# Patient Record
Sex: Female | Born: 1950 | Race: White | Hispanic: No | Marital: Married | Smoking: Never smoker
Health system: Southern US, Community
[De-identification: ages and names within clinical notes are randomized; demographics above are authoritative.]

## PROBLEM LIST (undated history)

## (undated) DIAGNOSIS — E7800 Pure hypercholesterolemia, unspecified: Secondary | ICD-10-CM

## (undated) DIAGNOSIS — K219 Gastro-esophageal reflux disease without esophagitis: Secondary | ICD-10-CM

## (undated) DIAGNOSIS — M1990 Unspecified osteoarthritis, unspecified site: Secondary | ICD-10-CM

## (undated) DIAGNOSIS — H269 Unspecified cataract: Secondary | ICD-10-CM

## (undated) DIAGNOSIS — E119 Type 2 diabetes mellitus without complications: Secondary | ICD-10-CM

## (undated) DIAGNOSIS — R42 Dizziness and giddiness: Secondary | ICD-10-CM

## (undated) DIAGNOSIS — I10 Essential (primary) hypertension: Secondary | ICD-10-CM

## (undated) DIAGNOSIS — E039 Hypothyroidism, unspecified: Secondary | ICD-10-CM

## (undated) DIAGNOSIS — T7840XA Allergy, unspecified, initial encounter: Secondary | ICD-10-CM

## (undated) HISTORY — DX: Unspecified cataract: H26.9

## (undated) HISTORY — PX: DILATION AND CURETTAGE OF UTERUS: SHX78

## (undated) HISTORY — PX: IR FIBRIN GLUE REPAIR ANAL FISTULA: IMG2325

## (undated) HISTORY — DX: Allergy, unspecified, initial encounter: T78.40XA

## (undated) HISTORY — DX: Unspecified osteoarthritis, unspecified site: M19.90

---

## 2001-07-07 ENCOUNTER — Emergency Department (HOSPITAL_COMMUNITY): Admission: EM | Admit: 2001-07-07 | Discharge: 2001-07-07 | Payer: Self-pay | Admitting: Emergency Medicine

## 2001-09-04 ENCOUNTER — Other Ambulatory Visit: Admission: RE | Admit: 2001-09-04 | Discharge: 2001-09-04 | Payer: Self-pay | Admitting: *Deleted

## 2002-06-20 ENCOUNTER — Encounter: Payer: Self-pay | Admitting: Surgery

## 2002-06-20 ENCOUNTER — Encounter: Admission: RE | Admit: 2002-06-20 | Discharge: 2002-06-20 | Payer: Self-pay | Admitting: Surgery

## 2002-07-16 ENCOUNTER — Ambulatory Visit (HOSPITAL_BASED_OUTPATIENT_CLINIC_OR_DEPARTMENT_OTHER): Admission: RE | Admit: 2002-07-16 | Discharge: 2002-07-16 | Payer: Self-pay | Admitting: Surgery

## 2007-09-24 ENCOUNTER — Encounter: Admission: RE | Admit: 2007-09-24 | Discharge: 2007-09-24 | Payer: Self-pay | Admitting: Orthopedic Surgery

## 2010-01-25 ENCOUNTER — Encounter: Admission: RE | Admit: 2010-01-25 | Discharge: 2010-01-25 | Payer: Self-pay | Admitting: Sports Medicine

## 2010-04-14 ENCOUNTER — Ambulatory Visit (HOSPITAL_BASED_OUTPATIENT_CLINIC_OR_DEPARTMENT_OTHER): Admission: RE | Admit: 2010-04-14 | Discharge: 2010-04-14 | Payer: Self-pay | Admitting: Gynecology

## 2011-01-18 LAB — POCT I-STAT 4, (NA,K, GLUC, HGB,HCT)
Glucose, Bld: 127 mg/dL — ABNORMAL HIGH (ref 70–99)
HCT: 40 % (ref 36.0–46.0)
Hemoglobin: 13.6 g/dL (ref 12.0–15.0)
Potassium: 3.5 mEq/L (ref 3.5–5.1)
Sodium: 141 mEq/L (ref 135–145)

## 2011-01-18 LAB — GLUCOSE, CAPILLARY: Glucose-Capillary: 135 mg/dL — ABNORMAL HIGH (ref 70–99)

## 2011-03-19 NOTE — Op Note (Signed)
NAME:  Angelica Mccarthy, Angelica Mccarthy                          ACCOUNT NO.:  [id number]   MEDICAL RECORD NO.:  [id number]                   PATIENT TYPE:  AMB   LOCATION:  DSC                                  FACILITY:  [facility]   PHYSICIAN:  Thornton Park. Daphine Deutscher, M.D.             DATE OF BIRTH:  07-Nov-1950   DATE OF PROCEDURE:  07/16/2002  DATE OF DISCHARGE:                                 OPERATIVE REPORT   CCS#  [id number]   PREOPERATIVE DIAGNOSES:  Twenty-year history of recurrent perirectal  abscesses and probable fistula.   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   OPERATION PERFORMED:   SURGEON:  Matthew B. Daphine Deutscher, M.D.   ANESTHESIA:  General.   INDICATIONS FOR PROCEDURE:  The patient is a 60 year old lady who was seen  in the office by one of my partners back in March with recurrent perirectal  abscess which was drained.  She presented to me in August and at the 11  o'clock position, she had a little of drainage which with the probe appeared  to be a fistula.  Informed consent was obtained regarding fistulectomy with  risk being incontinence.  Also mentioned was the possibility of putting in a  seton.   DESCRIPTION OF PROCEDURE:  The patient was taken to room 8 [facility] and placed in stirrups and given general anesthesia.  Anal  exam showed her to have numerous skin tags externally.  She had scarring  particularly on the right side at the 10 o'clock position where she has had  this recurrent abscess.  Through the small little purulent draining sinus, I  inserted a probe which with my finger in the anus and rectum I was able to  feel it go in radially.  It seemed to go into the distal half centimeter and  it seemed to be within the external subcutaneous sphincter.  With the anal  retractor in place, I then began opening from outside in.  I felt that this  was an external subcu  sphincter and that complete fistulotomy fistulectomy  was in order.  I divided this with a knife onto the probe.  I then used  scissors to cut out the friable tissue lining the fistula and then destroyed  the rest with electrocautery.  This all was consistent with inflammatory  reactive tissue.  Hemostasis was achieved and lidocaine  was injected into the area.  Betadine ointment was massaged into the area.  The patient was given Vicodin to take for pain and will be followed up in  the office in two to three weeks.  Instructions were given regarding sitz  baths.   PLAN:  Return in two to three weeks.  Thornton Park Daphine Deutscher, M.D.    MBM/MEDQ  D:  07/16/2002  T:  07/16/2002  Job:  [id number]   cc:   Oley Balm. Georgina Pillion, M.D.  [address]  [place]  [place] [address]  Fax: [phone]

## 2016-05-27 ENCOUNTER — Ambulatory Visit
Admission: RE | Admit: 2016-05-27 | Discharge: 2016-05-27 | Disposition: A | Discharge status: Home / Self Care | Payer: BLUE CROSS/BLUE SHIELD | Source: Ambulatory Visit | Attending: Family Medicine | Admitting: Family Medicine

## 2016-05-27 ENCOUNTER — Other Ambulatory Visit: Payer: Self-pay | Admitting: Family Medicine

## 2016-05-27 DIAGNOSIS — R1013 Epigastric pain: Secondary | ICD-10-CM

## 2016-05-27 MED ORDER — IOPAMIDOL (ISOVUE-300) INJECTION 61%
100.0000 mL | Freq: Once | INTRAVENOUS | Status: AC | PRN
  Administered 2016-05-27: 100 mL via INTRAVENOUS

## 2016-05-28 ENCOUNTER — Ambulatory Visit
Admission: RE | Admit: 2016-05-28 | Discharge: 2016-05-28 | Disposition: A | Discharge status: Home / Self Care | Payer: BLUE CROSS/BLUE SHIELD | Source: Ambulatory Visit | Attending: Family Medicine | Admitting: Family Medicine

## 2016-05-28 ENCOUNTER — Other Ambulatory Visit: Payer: Self-pay | Admitting: Family Medicine

## 2016-05-28 DIAGNOSIS — R918 Other nonspecific abnormal finding of lung field: Secondary | ICD-10-CM

## 2016-05-28 MED ORDER — IOPAMIDOL (ISOVUE-300) INJECTION 61%
75.0000 mL | Freq: Once | INTRAVENOUS | Status: AC | PRN
  Administered 2016-05-28: 75 mL via INTRAVENOUS

## 2016-05-31 ENCOUNTER — Other Ambulatory Visit: Payer: Self-pay | Admitting: Family Medicine

## 2016-05-31 DIAGNOSIS — R1013 Epigastric pain: Secondary | ICD-10-CM

## 2016-06-04 ENCOUNTER — Ambulatory Visit
Admission: RE | Admit: 2016-06-04 | Discharge: 2016-06-04 | Disposition: A | Discharge status: Home / Self Care | Payer: BLUE CROSS/BLUE SHIELD | Source: Ambulatory Visit | Attending: Family Medicine | Admitting: Family Medicine

## 2016-06-04 DIAGNOSIS — R1013 Epigastric pain: Secondary | ICD-10-CM

## 2016-12-16 ENCOUNTER — Other Ambulatory Visit: Payer: Self-pay | Admitting: Family Medicine

## 2016-12-16 DIAGNOSIS — R918 Other nonspecific abnormal finding of lung field: Secondary | ICD-10-CM

## 2016-12-20 ENCOUNTER — Ambulatory Visit
Admission: RE | Admit: 2016-12-20 | Discharge: 2016-12-20 | Disposition: A | Discharge status: Home / Self Care | Payer: BLUE CROSS/BLUE SHIELD | Source: Ambulatory Visit | Attending: Family Medicine | Admitting: Family Medicine

## 2016-12-20 DIAGNOSIS — R918 Other nonspecific abnormal finding of lung field: Secondary | ICD-10-CM

## 2017-10-04 DIAGNOSIS — J329 Chronic sinusitis, unspecified: Secondary | ICD-10-CM | POA: Diagnosis not present

## 2017-10-12 DIAGNOSIS — M9905 Segmental and somatic dysfunction of pelvic region: Secondary | ICD-10-CM | POA: Diagnosis not present

## 2017-10-12 DIAGNOSIS — M9904 Segmental and somatic dysfunction of sacral region: Secondary | ICD-10-CM | POA: Diagnosis not present

## 2017-10-12 DIAGNOSIS — M5136 Other intervertebral disc degeneration, lumbar region: Secondary | ICD-10-CM | POA: Diagnosis not present

## 2017-10-12 DIAGNOSIS — M9903 Segmental and somatic dysfunction of lumbar region: Secondary | ICD-10-CM | POA: Diagnosis not present

## 2017-10-14 DIAGNOSIS — M9905 Segmental and somatic dysfunction of pelvic region: Secondary | ICD-10-CM | POA: Diagnosis not present

## 2017-10-14 DIAGNOSIS — M5136 Other intervertebral disc degeneration, lumbar region: Secondary | ICD-10-CM | POA: Diagnosis not present

## 2017-10-14 DIAGNOSIS — M9903 Segmental and somatic dysfunction of lumbar region: Secondary | ICD-10-CM | POA: Diagnosis not present

## 2017-10-14 DIAGNOSIS — M9904 Segmental and somatic dysfunction of sacral region: Secondary | ICD-10-CM | POA: Diagnosis not present

## 2017-10-27 DIAGNOSIS — M9903 Segmental and somatic dysfunction of lumbar region: Secondary | ICD-10-CM | POA: Diagnosis not present

## 2017-10-27 DIAGNOSIS — M9904 Segmental and somatic dysfunction of sacral region: Secondary | ICD-10-CM | POA: Diagnosis not present

## 2017-10-27 DIAGNOSIS — M5136 Other intervertebral disc degeneration, lumbar region: Secondary | ICD-10-CM | POA: Diagnosis not present

## 2017-10-27 DIAGNOSIS — M9905 Segmental and somatic dysfunction of pelvic region: Secondary | ICD-10-CM | POA: Diagnosis not present

## 2017-10-31 DIAGNOSIS — H25811 Combined forms of age-related cataract, right eye: Secondary | ICD-10-CM | POA: Diagnosis not present

## 2017-10-31 DIAGNOSIS — E113293 Type 2 diabetes mellitus with mild nonproliferative diabetic retinopathy without macular edema, bilateral: Secondary | ICD-10-CM | POA: Diagnosis not present

## 2017-10-31 DIAGNOSIS — H25813 Combined forms of age-related cataract, bilateral: Secondary | ICD-10-CM | POA: Diagnosis not present

## 2017-11-02 DIAGNOSIS — M5136 Other intervertebral disc degeneration, lumbar region: Secondary | ICD-10-CM | POA: Diagnosis not present

## 2017-11-02 DIAGNOSIS — M9905 Segmental and somatic dysfunction of pelvic region: Secondary | ICD-10-CM | POA: Diagnosis not present

## 2017-11-02 DIAGNOSIS — M9903 Segmental and somatic dysfunction of lumbar region: Secondary | ICD-10-CM | POA: Diagnosis not present

## 2017-11-02 DIAGNOSIS — M9904 Segmental and somatic dysfunction of sacral region: Secondary | ICD-10-CM | POA: Diagnosis not present

## 2017-11-07 DIAGNOSIS — M9903 Segmental and somatic dysfunction of lumbar region: Secondary | ICD-10-CM | POA: Diagnosis not present

## 2017-11-07 DIAGNOSIS — M5136 Other intervertebral disc degeneration, lumbar region: Secondary | ICD-10-CM | POA: Diagnosis not present

## 2017-11-07 DIAGNOSIS — M9905 Segmental and somatic dysfunction of pelvic region: Secondary | ICD-10-CM | POA: Diagnosis not present

## 2017-11-07 DIAGNOSIS — M9904 Segmental and somatic dysfunction of sacral region: Secondary | ICD-10-CM | POA: Diagnosis not present

## 2017-11-15 DIAGNOSIS — M9904 Segmental and somatic dysfunction of sacral region: Secondary | ICD-10-CM | POA: Diagnosis not present

## 2017-11-15 DIAGNOSIS — M9905 Segmental and somatic dysfunction of pelvic region: Secondary | ICD-10-CM | POA: Diagnosis not present

## 2017-11-15 DIAGNOSIS — M9903 Segmental and somatic dysfunction of lumbar region: Secondary | ICD-10-CM | POA: Diagnosis not present

## 2017-11-15 DIAGNOSIS — M5136 Other intervertebral disc degeneration, lumbar region: Secondary | ICD-10-CM | POA: Diagnosis not present

## 2017-11-16 NOTE — Patient Instructions (Signed)
Your procedure is scheduled on: 11/28/2017   Report to [facility] at  11   AM.  Call this number if you have problems the morning of surgery: [phone]   Do not eat food or drink liquids :After Midnight.      Take these medicines the morning of surgery with A SIP OF WATER: atenolol, zyrtec, avapro, levothyroxine, antivert, prilosec.   Do not wear jewelry, make-up or nail polish.  Do not wear lotions, powders, or perfumes. You may wear deodorant.  Do not shave 48 hours prior to surgery.  Do not bring valuables to the hospital.  Contacts, dentures or bridgework may not be worn into surgery.  Leave suitcase in the car. After surgery it may be brought to your room.  For patients admitted to the hospital, checkout time is 11:00 AM the day of discharge.   Patients discharged the day of surgery will not be allowed to drive home.  :     Please read over the following fact sheets that you were given: Coughing and Deep Breathing, Surgical Site Infection Prevention, Anesthesia Post-op Instructions and Care and Recovery After Surgery    Cataract A cataract is a clouding of the lens of the eye. When a lens becomes cloudy, vision is reduced based on the degree and nature of the clouding. Many cataracts reduce vision to some degree. Some cataracts make people more near-sighted as they develop. Other cataracts increase glare. Cataracts that are ignored and become worse can sometimes look white. The white color can be seen through the pupil. CAUSES   Aging. However, cataracts may occur at any age, even in newborns.   Certain drugs.   Trauma to the eye.   Certain diseases such as diabetes.   Specific eye diseases such as chronic inflammation inside the eye or a sudden attack of a rare form of glaucoma.   Inherited or acquired medical problems.  SYMPTOMS   Gradual, progressive drop in vision in the affected eye.   Severe, rapid visual loss. This most often happens when trauma is the cause.   DIAGNOSIS  To detect a cataract, an eye doctor examines the lens. Cataracts are best diagnosed with an exam of the eyes with the pupils enlarged (dilated) by drops.  TREATMENT  For an early cataract, vision may improve by using different eyeglasses or stronger lighting. If that does not help your vision, surgery is the only effective treatment. A cataract needs to be surgically removed when vision loss interferes with your everyday activities, such as driving, reading, or watching TV. A cataract may also have to be removed if it prevents examination or treatment of another eye problem. Surgery removes the cloudy lens and usually replaces it with a substitute lens (intraocular lens, IOL).  At a time when both you and your doctor agree, the cataract will be surgically removed. If you have cataracts in both eyes, only one is usually removed at a time. This allows the operated eye to heal and be out of danger from any possible problems after surgery (such as infection or poor wound healing). In rare cases, a cataract may be doing damage to your eye. In these cases, your caregiver may advise surgical removal right away. The vast majority of people who have cataract surgery have better vision afterward. HOME CARE INSTRUCTIONS  If you are not planning surgery, you may be asked to do the following:  Use different eyeglasses.   Use stronger or brighter lighting.   Ask your eye  doctor about reducing your medicine dose or changing medicines if it is thought that a medicine caused your cataract. Changing medicines does not make the cataract go away on its own.   Become familiar with your surroundings. Poor vision can lead to injury. Avoid bumping into things on the affected side. You are at a higher risk for tripping or falling.   Exercise extreme care when driving or operating machinery.   Wear sunglasses if you are sensitive to bright light or experiencing problems with glare.  SEEK IMMEDIATE MEDICAL CARE  IF:   You have a worsening or sudden vision loss.   You notice redness, swelling, or increasing pain in the eye.   You have a fever.  Document Released: 10/18/2005 Document Revised: 10/07/2011 Document Reviewed: 06/11/2011 [facility] Patient Information 2012 [place].PATIENT INSTRUCTIONS POST-ANESTHESIA  IMMEDIATELY FOLLOWING SURGERY:  Do not drive or operate machinery for the first twenty four hours after surgery.  Do not make any important decisions for twenty four hours after surgery or while taking narcotic pain medications or sedatives.  If you develop intractable nausea and vomiting or a severe headache please notify your doctor immediately.  FOLLOW-UP:  Please make an appointment with your surgeon as instructed. You do not need to follow up with anesthesia unless specifically instructed to do so.  WOUND CARE INSTRUCTIONS (if applicable):  Keep a dry clean dressing on the anesthesia/puncture wound site if there is drainage.  Once the wound has quit draining you may leave it open to air.  Generally you should leave the bandage intact for twenty four hours unless there is drainage.  If the epidural site drains for more than 36-48 hours please call the anesthesia department.  QUESTIONS?:  Please feel free to call your physician or the hospital operator if you have any questions, and they will be happy to assist you.

## 2017-11-21 ENCOUNTER — Encounter (HOSPITAL_COMMUNITY): Payer: Self-pay

## 2017-11-21 ENCOUNTER — Encounter (HOSPITAL_COMMUNITY)
Admission: RE | Admit: 2017-11-21 | Discharge: 2017-11-21 | Disposition: A | Discharge status: Home / Self Care | Payer: Medicare Other | Source: Ambulatory Visit | Attending: Ophthalmology | Admitting: Ophthalmology

## 2017-11-21 ENCOUNTER — Other Ambulatory Visit: Payer: Self-pay

## 2017-11-21 DIAGNOSIS — Z01818 Encounter for other preprocedural examination: Secondary | ICD-10-CM | POA: Insufficient documentation

## 2017-11-21 DIAGNOSIS — H269 Unspecified cataract: Secondary | ICD-10-CM | POA: Diagnosis not present

## 2017-11-21 DIAGNOSIS — Z01812 Encounter for preprocedural laboratory examination: Secondary | ICD-10-CM | POA: Diagnosis not present

## 2017-11-21 HISTORY — DX: Dizziness and giddiness: R42

## 2017-11-21 HISTORY — DX: Essential (primary) hypertension: I10

## 2017-11-21 HISTORY — DX: Type 2 diabetes mellitus without complications: E11.9

## 2017-11-21 HISTORY — DX: Pure hypercholesterolemia, unspecified: E78.00

## 2017-11-21 HISTORY — DX: Hypothyroidism, unspecified: E03.9

## 2017-11-21 HISTORY — DX: Gastro-esophageal reflux disease without esophagitis: K21.9

## 2017-11-21 LAB — BASIC METABOLIC PANEL
Anion gap: 11 (ref 5–15)
BUN: 15 mg/dL (ref 6–20)
CALCIUM: 9.7 mg/dL (ref 8.9–10.3)
CO2: 26 mmol/L (ref 22–32)
CREATININE: 0.82 mg/dL (ref 0.44–1.00)
Chloride: 103 mmol/L (ref 101–111)
GFR calc Af Amer: >60 mL/min (ref >60)
GFR calc non Af Amer: >60 mL/min (ref >60)
GLUCOSE: 104 mg/dL — AB (ref 65–99)
Potassium: 4.3 mmol/L (ref 3.5–5.1)
Sodium: 140 mmol/L (ref 135–145)

## 2017-11-21 LAB — CBC WITH DIFFERENTIAL/PLATELET
Basophils Absolute: 0.1 10*3/uL (ref 0.0–0.1)
Basophils Relative: 1 %
Eosinophils Absolute: 0.4 10*3/uL (ref 0.0–0.7)
Eosinophils Relative: 5 %
HEMATOCRIT: 39.1 % (ref 36.0–46.0)
HEMOGLOBIN: 12.6 g/dL (ref 12.0–15.0)
LYMPHS ABS: 3.4 10*3/uL (ref 0.7–4.0)
LYMPHS PCT: 37 %
MCH: 29.5 pg (ref 26.0–34.0)
MCHC: 32.2 g/dL (ref 30.0–36.0)
MCV: 91.6 fL (ref 78.0–100.0)
MONO ABS: 0.8 10*3/uL (ref 0.1–1.0)
MONOS PCT: 9 %
NEUTROS ABS: 4.4 10*3/uL (ref 1.7–7.7)
Neutrophils Relative %: 48 %
Platelets: 328 10*3/uL (ref 150–400)
RBC: 4.27 MIL/uL (ref 3.87–5.11)
RDW: 12.8 % (ref 11.5–15.5)
WBC: 9 10*3/uL (ref 4.0–10.5)

## 2017-11-21 LAB — HEMOGLOBIN A1C
Hgb A1c MFr Bld: 6 % — ABNORMAL HIGH (ref 4.8–5.6)
Mean Plasma Glucose: 125.5 mg/dL

## 2017-11-21 LAB — GLUCOSE, CAPILLARY: Glucose-Capillary: 97 mg/dL (ref 65–99)

## 2017-11-22 DIAGNOSIS — M9905 Segmental and somatic dysfunction of pelvic region: Secondary | ICD-10-CM | POA: Diagnosis not present

## 2017-11-22 DIAGNOSIS — M9903 Segmental and somatic dysfunction of lumbar region: Secondary | ICD-10-CM | POA: Diagnosis not present

## 2017-11-22 DIAGNOSIS — M9904 Segmental and somatic dysfunction of sacral region: Secondary | ICD-10-CM | POA: Diagnosis not present

## 2017-11-22 DIAGNOSIS — M5136 Other intervertebral disc degeneration, lumbar region: Secondary | ICD-10-CM | POA: Diagnosis not present

## 2017-11-28 ENCOUNTER — Encounter (HOSPITAL_COMMUNITY): Payer: Self-pay | Admitting: *Deleted

## 2017-11-28 ENCOUNTER — Other Ambulatory Visit: Payer: Self-pay

## 2017-11-28 ENCOUNTER — Ambulatory Visit (HOSPITAL_COMMUNITY): Payer: Medicare Other | Admitting: Anesthesiology

## 2017-11-28 ENCOUNTER — Ambulatory Visit (HOSPITAL_COMMUNITY)
Admission: RE | Admit: 2017-11-28 | Discharge: 2017-11-28 | Disposition: A | Discharge status: Home / Self Care | Payer: Medicare Other | Source: Ambulatory Visit | Attending: Ophthalmology | Admitting: Ophthalmology

## 2017-11-28 ENCOUNTER — Encounter (HOSPITAL_COMMUNITY)
Admission: RE | Disposition: A | Discharge status: Home / Self Care | Payer: Self-pay | Source: Ambulatory Visit | Attending: Ophthalmology

## 2017-11-28 DIAGNOSIS — H25811 Combined forms of age-related cataract, right eye: Secondary | ICD-10-CM | POA: Insufficient documentation

## 2017-11-28 DIAGNOSIS — K219 Gastro-esophageal reflux disease without esophagitis: Secondary | ICD-10-CM | POA: Insufficient documentation

## 2017-11-28 DIAGNOSIS — Z7982 Long term (current) use of aspirin: Secondary | ICD-10-CM | POA: Insufficient documentation

## 2017-11-28 DIAGNOSIS — E039 Hypothyroidism, unspecified: Secondary | ICD-10-CM | POA: Diagnosis not present

## 2017-11-28 DIAGNOSIS — E119 Type 2 diabetes mellitus without complications: Secondary | ICD-10-CM | POA: Insufficient documentation

## 2017-11-28 DIAGNOSIS — H2511 Age-related nuclear cataract, right eye: Secondary | ICD-10-CM | POA: Diagnosis not present

## 2017-11-28 DIAGNOSIS — I10 Essential (primary) hypertension: Secondary | ICD-10-CM | POA: Diagnosis not present

## 2017-11-28 DIAGNOSIS — Z79899 Other long term (current) drug therapy: Secondary | ICD-10-CM | POA: Insufficient documentation

## 2017-11-28 DIAGNOSIS — Z7984 Long term (current) use of oral hypoglycemic drugs: Secondary | ICD-10-CM | POA: Diagnosis not present

## 2017-11-28 HISTORY — PX: CATARACT EXTRACTION W/PHACO: SHX586

## 2017-11-28 LAB — GLUCOSE, CAPILLARY: GLUCOSE-CAPILLARY: 113 mg/dL — AB (ref 65–99)

## 2017-11-28 SURGERY — PHACOEMULSIFICATION, CATARACT, WITH IOL INSERTION
Anesthesia: Monitor Anesthesia Care | Site: Eye | Laterality: Right

## 2017-11-28 MED ORDER — PHENYLEPHRINE HCL 2.5 % OP SOLN
1.0000 [drp] | OPHTHALMIC | Status: AC
  Administered 2017-11-28 (×3): 1 [drp] via OPHTHALMIC

## 2017-11-28 MED ORDER — CYCLOPENTOLATE-PHENYLEPHRINE 0.2-1 % OP SOLN
1.0000 [drp] | OPHTHALMIC | Status: AC
  Administered 2017-11-28 (×3): 1 [drp] via OPHTHALMIC

## 2017-11-28 MED ORDER — BSS IO SOLN
INTRAOCULAR | Status: DC | PRN
  Administered 2017-11-28: 15 mL

## 2017-11-28 MED ORDER — LIDOCAINE HCL 3.5 % OP GEL
1.0000 "application " | Freq: Once | OPHTHALMIC | Status: AC
  Administered 2017-11-28: 1 via OPHTHALMIC

## 2017-11-28 MED ORDER — EPINEPHRINE PF 1 MG/ML IJ SOLN
INTRAOCULAR | Status: DC | PRN
  Administered 2017-11-28: 500 mL

## 2017-11-28 MED ORDER — NEOMYCIN-POLYMYXIN-DEXAMETH 3.5-10000-0.1 OP SUSP
OPHTHALMIC | Status: DC | PRN
  Administered 2017-11-28: 2 [drp] via OPHTHALMIC

## 2017-11-28 MED ORDER — MIDAZOLAM HCL 2 MG/2ML IJ SOLN
1.0000 mg | Freq: Once | INTRAMUSCULAR | Status: AC | PRN
  Administered 2017-11-28: 2 mg via INTRAVENOUS
  Filled 2017-11-28: qty 2

## 2017-11-28 MED ORDER — TETRACAINE HCL 0.5 % OP SOLN
1.0000 [drp] | OPHTHALMIC | Status: AC
  Administered 2017-11-28 (×3): 1 [drp] via OPHTHALMIC

## 2017-11-28 MED ORDER — ONDANSETRON 4 MG PO TBDP
4.0000 mg | ORAL_TABLET | Freq: Once | ORAL | Status: AC
  Administered 2017-11-28: 4 mg via ORAL

## 2017-11-28 MED ORDER — ONDANSETRON 4 MG PO TBDP
ORAL_TABLET | ORAL | Status: AC
  Filled 2017-11-28: qty 1

## 2017-11-28 MED ORDER — LIDOCAINE HCL (PF) 1 % IJ SOLN
INTRAMUSCULAR | Status: DC | PRN
  Administered 2017-11-28: .6 mL

## 2017-11-28 MED ORDER — PROVISC 10 MG/ML IO SOLN
INTRAOCULAR | Status: DC | PRN
  Administered 2017-11-28: 0.85 mL via INTRAOCULAR

## 2017-11-28 MED ORDER — LACTATED RINGERS IV SOLN
INTRAVENOUS | Status: DC
  Administered 2017-11-28: 09:00:00 via INTRAVENOUS

## 2017-11-28 MED ORDER — POVIDONE-IODINE 5 % OP SOLN
OPHTHALMIC | Status: DC | PRN
  Administered 2017-11-28: 1 via OPHTHALMIC

## 2017-11-28 SURGICAL SUPPLY — 12 items
CLOTH BEACON ORANGE TIMEOUT ST (SAFETY) ×2 IMPLANT
EYE SHIELD UNIVERSAL CLEAR (GAUZE/BANDAGES/DRESSINGS) ×2 IMPLANT
GLOVE BIOGEL PI IND STRL 6.5 (GLOVE) IMPLANT
GLOVE BIOGEL PI IND STRL 7.0 (GLOVE) IMPLANT
GLOVE BIOGEL PI INDICATOR 6.5 (GLOVE) ×2
GLOVE BIOGEL PI INDICATOR 7.0 (GLOVE) ×2
LENS ALC ACRYL/TECN (Ophthalmic Related) ×2 IMPLANT
PAD ARMBOARD 7.5X6 YLW CONV (MISCELLANEOUS) ×2 IMPLANT
SYRINGE LUER LOK 1CC (MISCELLANEOUS) ×2 IMPLANT
TAPE SURG TRANSPORE 1 IN (GAUZE/BANDAGES/DRESSINGS) IMPLANT
TAPE SURGICAL TRANSPORE 1 IN (GAUZE/BANDAGES/DRESSINGS) ×2
WATER STERILE IRR 250ML POUR (IV SOLUTION) ×2 IMPLANT

## 2017-11-28 NOTE — Discharge Instructions (Signed)

## 2017-11-28 NOTE — Anesthesia Preprocedure Evaluation (Signed)
Anesthesia Evaluation  Patient identified by MRN, date of birth, ID band Patient awake    Airway Mallampati: I  TM Distance: >3 FB Neck ROM: Full    Dental  (+) Teeth Intact   Pulmonary    Pulmonary exam normal        Cardiovascular hypertension, Pt. on home beta blockers and Pt. on medications Normal cardiovascular exam Rhythm:Regular Rate:Normal  Normal sinus rhythm Low voltage QRS  (jan 2019)   Neuro/Psych    GI/Hepatic GERD  Medicated,  Endo/Other  diabetes, Well Controlled, Type 2Hypothyroidism   Renal/GU      Musculoskeletal   Abdominal Normal abdominal exam  (+)   Peds  Hematology   Anesthesia Other Findings   Reproductive/Obstetrics                             Anesthesia Physical Anesthesia Plan  ASA: III  Anesthesia Plan: MAC   Post-op Pain Management:    Induction:   PONV Risk Score and Plan:   Airway Management Planned: Nasal Cannula  Additional Equipment:   Intra-op Plan:   Post-operative Plan:   Informed Consent: I have reviewed the patients History and Physical, chart, labs and discussed the procedure including the risks, benefits and alternatives for the proposed anesthesia with the patient or authorized representative who has indicated his/her understanding and acceptance.   Dental advisory given  Plan Discussed with: CRNA  Anesthesia Plan Comments:         Anesthesia Quick Evaluation

## 2017-11-28 NOTE — Anesthesia Procedure Notes (Signed)
Procedure Name: MAC Date/Time: 11/28/2017 9:06 AM Performed by: Andree Elk Amy A, CRNA Pre-anesthesia Checklist: Patient identified, Timeout performed, Emergency Drugs available, Suction available and Patient being monitored Oxygen Delivery Method: Nasal cannula

## 2017-11-28 NOTE — Transfer of Care (Signed)
Immediate Anesthesia Transfer of Care Note  Patient: Angelica Mccarthy  Procedure(s) Performed: CATARACT EXTRACTION PHACO AND INTRAOCULAR LENS PLACEMENT RIGHT EYE (Right Eye)  Patient Location: Short Stay  Anesthesia Type:MAC  Level of Consciousness: awake, alert , oriented and patient cooperative  Airway & Oxygen Therapy: Patient Spontanous Breathing  Post-op Assessment: Report given to RN and Post -op Vital signs reviewed and stable  Post vital signs: Reviewed and stable  Last Vitals:  Vitals:   11/28/17 0845 11/28/17 0850  BP: 111/68   Pulse:    Resp: (!) 41 (!) 26  Temp:    SpO2: 93% 94%    Last Pain:  Vitals:   11/28/17 0828  TempSrc: Oral         Complications: No apparent anesthesia complications

## 2017-11-28 NOTE — H&P (Signed)
I have reviewed the H&P, the patient was re-examined, and I have identified no interval changes in medical condition and plan of care since the history and physical of record  

## 2017-11-28 NOTE — Op Note (Signed)
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  Assistants: None  Anesthesia: Topical with MAC  Indications: Painless, progressive loss of vision with compromise of daily activities.  Surgery: Cataract Extraction with Intraocular lens Implant Right Eye  Discription: The patient had dilating drops and viscous lidocaine placed into the Right eye in the pre-op holding area. After transfer to the operating room, a time out was performed. The patient was then prepped and draped. Beginning with a 44m blade a paracentesis port was made at the surgeon's 2 o'clock position. The anterior chamber was then filled with 1% non-preserved lidocaine. This was followed by filling the anterior chamber with Provisc.  A 2.479mkeratome blade was used to make a clear corneal incision at the temporal limbus.  A bent cystatome needle was used to create a continuous tear capsulotomy. Hydrodissection was performed with balanced salt solution on a Fine canula. The lens nucleus was then removed using the phacoemulsification handpiece. Residual cortex was removed with the I&A handpiece. The anterior chamber and capsular bag were refilled with Provisc. A posterior chamber intraocular lens was placed into the capsular bag with it's injector. The implant was positioned with the Kuglan hook. The Provisc was then removed from the anterior chamber and capsular bag with the I&A handpiece. Stromal hydration of the main incision and paracentesis port was performed with BSS on a Fine canula. The wounds were tested for leak which was negative. The patient tolerated the procedure well. There were no operative complications. The patient was then transferred to the recovery room in stable condition.  Complications: None  Specimen: None  EBL: None  Prosthetic device: J&J Technis, PCB00, power 15.5,  SN [id number]

## 2017-11-28 NOTE — Anesthesia Postprocedure Evaluation (Signed)
Anesthesia Post Note  Patient: Angelica Mccarthy  Procedure(s) Performed: CATARACT EXTRACTION PHACO AND INTRAOCULAR LENS PLACEMENT RIGHT EYE (Right Eye)  Patient location during evaluation: Short Stay Anesthesia Type: MAC Level of consciousness: awake and alert, oriented and patient cooperative Pain management: pain level controlled Vital Signs Assessment: post-procedure vital signs reviewed and stable Respiratory status: spontaneous breathing Cardiovascular status: stable Postop Assessment: no apparent nausea or vomiting Anesthetic complications: no     Last Vitals:  Vitals:   11/28/17 0845 11/28/17 0850  BP: 111/68   Pulse:    Resp: (!) 41 (!) 26  Temp:    SpO2: 93% 94%    Last Pain:  Vitals:   11/28/17 0828  TempSrc: Oral                 ADAMS, AMY A

## 2017-11-29 ENCOUNTER — Encounter (HOSPITAL_COMMUNITY): Payer: Self-pay | Admitting: Ophthalmology

## 2017-11-29 DIAGNOSIS — M9904 Segmental and somatic dysfunction of sacral region: Secondary | ICD-10-CM | POA: Diagnosis not present

## 2017-11-29 DIAGNOSIS — M9905 Segmental and somatic dysfunction of pelvic region: Secondary | ICD-10-CM | POA: Diagnosis not present

## 2017-11-29 DIAGNOSIS — M5136 Other intervertebral disc degeneration, lumbar region: Secondary | ICD-10-CM | POA: Diagnosis not present

## 2017-11-29 DIAGNOSIS — M9903 Segmental and somatic dysfunction of lumbar region: Secondary | ICD-10-CM | POA: Diagnosis not present

## 2017-12-05 DIAGNOSIS — H25812 Combined forms of age-related cataract, left eye: Secondary | ICD-10-CM | POA: Diagnosis not present

## 2017-12-07 ENCOUNTER — Encounter (HOSPITAL_COMMUNITY): Payer: Self-pay

## 2017-12-07 ENCOUNTER — Encounter (HOSPITAL_COMMUNITY)
Admission: RE | Admit: 2017-12-07 | Discharge: 2017-12-07 | Disposition: A | Discharge status: Home / Self Care | Payer: Medicare Other | Source: Ambulatory Visit | Attending: Ophthalmology | Admitting: Ophthalmology

## 2017-12-08 DIAGNOSIS — M5136 Other intervertebral disc degeneration, lumbar region: Secondary | ICD-10-CM | POA: Diagnosis not present

## 2017-12-08 DIAGNOSIS — M9903 Segmental and somatic dysfunction of lumbar region: Secondary | ICD-10-CM | POA: Diagnosis not present

## 2017-12-08 DIAGNOSIS — M9905 Segmental and somatic dysfunction of pelvic region: Secondary | ICD-10-CM | POA: Diagnosis not present

## 2017-12-08 DIAGNOSIS — M9904 Segmental and somatic dysfunction of sacral region: Secondary | ICD-10-CM | POA: Diagnosis not present

## 2017-12-09 MED ORDER — CYCLOPENTOLATE-PHENYLEPHRINE 0.2-1 % OP SOLN
OPHTHALMIC | Status: AC
  Filled 2017-12-09: qty 2

## 2017-12-09 MED ORDER — TETRACAINE HCL 0.5 % OP SOLN
OPHTHALMIC | Status: AC
  Filled 2017-12-09: qty 4

## 2017-12-09 MED ORDER — LIDOCAINE HCL (PF) 1 % IJ SOLN
INTRAMUSCULAR | Status: AC
  Filled 2017-12-09: qty 2

## 2017-12-09 MED ORDER — LIDOCAINE HCL 3.5 % OP GEL
OPHTHALMIC | Status: AC
  Filled 2017-12-09: qty 1

## 2017-12-09 MED ORDER — PHENYLEPHRINE HCL 2.5 % OP SOLN
OPHTHALMIC | Status: AC
  Filled 2017-12-09: qty 15

## 2017-12-09 MED ORDER — NEOMYCIN-POLYMYXIN-DEXAMETH 3.5-10000-0.1 OP SUSP
OPHTHALMIC | Status: AC
  Filled 2017-12-09: qty 5

## 2017-12-12 ENCOUNTER — Ambulatory Visit (HOSPITAL_COMMUNITY): Payer: Medicare Other | Admitting: Anesthesiology

## 2017-12-12 ENCOUNTER — Encounter (HOSPITAL_COMMUNITY): Payer: Self-pay | Admitting: *Deleted

## 2017-12-12 ENCOUNTER — Ambulatory Visit (HOSPITAL_COMMUNITY)
Admission: RE | Admit: 2017-12-12 | Discharge: 2017-12-12 | Disposition: A | Discharge status: Home / Self Care | Payer: Medicare Other | Source: Ambulatory Visit | Attending: Ophthalmology | Admitting: Ophthalmology

## 2017-12-12 ENCOUNTER — Encounter (HOSPITAL_COMMUNITY)
Admission: RE | Disposition: A | Discharge status: Home / Self Care | Payer: Self-pay | Source: Ambulatory Visit | Attending: Ophthalmology

## 2017-12-12 DIAGNOSIS — E1136 Type 2 diabetes mellitus with diabetic cataract: Secondary | ICD-10-CM | POA: Diagnosis not present

## 2017-12-12 DIAGNOSIS — Z888 Allergy status to other drugs, medicaments and biological substances status: Secondary | ICD-10-CM | POA: Diagnosis not present

## 2017-12-12 DIAGNOSIS — I10 Essential (primary) hypertension: Secondary | ICD-10-CM | POA: Diagnosis not present

## 2017-12-12 DIAGNOSIS — E039 Hypothyroidism, unspecified: Secondary | ICD-10-CM | POA: Diagnosis not present

## 2017-12-12 DIAGNOSIS — Z7982 Long term (current) use of aspirin: Secondary | ICD-10-CM | POA: Diagnosis not present

## 2017-12-12 DIAGNOSIS — K219 Gastro-esophageal reflux disease without esophagitis: Secondary | ICD-10-CM | POA: Insufficient documentation

## 2017-12-12 DIAGNOSIS — H2512 Age-related nuclear cataract, left eye: Secondary | ICD-10-CM | POA: Diagnosis not present

## 2017-12-12 DIAGNOSIS — Z820 Family history of epilepsy and other diseases of the nervous system: Secondary | ICD-10-CM | POA: Insufficient documentation

## 2017-12-12 DIAGNOSIS — Z83511 Family history of glaucoma: Secondary | ICD-10-CM | POA: Insufficient documentation

## 2017-12-12 DIAGNOSIS — Z7984 Long term (current) use of oral hypoglycemic drugs: Secondary | ICD-10-CM | POA: Diagnosis not present

## 2017-12-12 DIAGNOSIS — Z9841 Cataract extraction status, right eye: Secondary | ICD-10-CM | POA: Insufficient documentation

## 2017-12-12 DIAGNOSIS — Z79899 Other long term (current) drug therapy: Secondary | ICD-10-CM | POA: Diagnosis not present

## 2017-12-12 DIAGNOSIS — H25812 Combined forms of age-related cataract, left eye: Secondary | ICD-10-CM | POA: Diagnosis not present

## 2017-12-12 DIAGNOSIS — Z803 Family history of malignant neoplasm of breast: Secondary | ICD-10-CM | POA: Diagnosis not present

## 2017-12-12 DIAGNOSIS — Z961 Presence of intraocular lens: Secondary | ICD-10-CM | POA: Diagnosis not present

## 2017-12-12 HISTORY — PX: CATARACT EXTRACTION W/PHACO: SHX586

## 2017-12-12 LAB — GLUCOSE, CAPILLARY: GLUCOSE-CAPILLARY: 109 mg/dL — AB (ref 65–99)

## 2017-12-12 SURGERY — PHACOEMULSIFICATION, CATARACT, WITH IOL INSERTION
Anesthesia: Monitor Anesthesia Care | Site: Eye | Laterality: Left

## 2017-12-12 MED ORDER — BSS IO SOLN
INTRAOCULAR | Status: DC | PRN
  Administered 2017-12-12: 15 mL

## 2017-12-12 MED ORDER — EPINEPHRINE PF 1 MG/ML IJ SOLN
INTRAMUSCULAR | Status: AC
  Filled 2017-12-12: qty 1

## 2017-12-12 MED ORDER — FENTANYL CITRATE (PF) 100 MCG/2ML IJ SOLN
25.0000 ug | Freq: Once | INTRAMUSCULAR | Status: AC
  Administered 2017-12-12: 25 ug via INTRAVENOUS

## 2017-12-12 MED ORDER — FENTANYL CITRATE (PF) 100 MCG/2ML IJ SOLN
INTRAMUSCULAR | Status: AC
  Filled 2017-12-12: qty 2

## 2017-12-12 MED ORDER — LACTATED RINGERS IV SOLN
INTRAVENOUS | Status: DC
  Administered 2017-12-12: 1000 mL via INTRAVENOUS

## 2017-12-12 MED ORDER — EPINEPHRINE PF 1 MG/ML IJ SOLN
INTRAOCULAR | Status: DC | PRN
  Administered 2017-12-12: 500 mL

## 2017-12-12 MED ORDER — POVIDONE-IODINE 5 % OP SOLN
OPHTHALMIC | Status: DC | PRN
  Administered 2017-12-12: 1 via OPHTHALMIC

## 2017-12-12 MED ORDER — PROVISC 10 MG/ML IO SOLN
INTRAOCULAR | Status: DC | PRN
  Administered 2017-12-12: 0.85 mL via INTRAOCULAR

## 2017-12-12 MED ORDER — CYCLOPENTOLATE-PHENYLEPHRINE 0.2-1 % OP SOLN
1.0000 [drp] | OPHTHALMIC | Status: AC
  Administered 2017-12-12 (×3): 1 [drp] via OPHTHALMIC

## 2017-12-12 MED ORDER — LIDOCAINE HCL 3.5 % OP GEL
1.0000 "application " | Freq: Once | OPHTHALMIC | Status: AC
  Administered 2017-12-12: 1 via OPHTHALMIC

## 2017-12-12 MED ORDER — MIDAZOLAM HCL 2 MG/2ML IJ SOLN
1.0000 mg | INTRAMUSCULAR | Status: AC
  Administered 2017-12-12: 2 mg via INTRAVENOUS

## 2017-12-12 MED ORDER — LIDOCAINE HCL (PF) 1 % IJ SOLN
INTRAMUSCULAR | Status: DC | PRN
  Administered 2017-12-12: .5 mL

## 2017-12-12 MED ORDER — TETRACAINE HCL 0.5 % OP SOLN
1.0000 [drp] | OPHTHALMIC | Status: AC
  Administered 2017-12-12 (×3): 1 [drp] via OPHTHALMIC

## 2017-12-12 MED ORDER — NEOMYCIN-POLYMYXIN-DEXAMETH 3.5-10000-0.1 OP SUSP
OPHTHALMIC | Status: DC | PRN
  Administered 2017-12-12: 2 [drp] via OPHTHALMIC

## 2017-12-12 MED ORDER — PHENYLEPHRINE HCL 2.5 % OP SOLN
1.0000 [drp] | OPHTHALMIC | Status: AC
  Administered 2017-12-12 (×3): 1 [drp] via OPHTHALMIC

## 2017-12-12 MED ORDER — MIDAZOLAM HCL 2 MG/2ML IJ SOLN
INTRAMUSCULAR | Status: AC
  Filled 2017-12-12: qty 2

## 2017-12-12 SURGICAL SUPPLY — 12 items
CLOTH BEACON ORANGE TIMEOUT ST (SAFETY) ×2 IMPLANT
EYE SHIELD UNIVERSAL CLEAR (GAUZE/BANDAGES/DRESSINGS) ×2 IMPLANT
GLOVE BIOGEL PI IND STRL 6.5 (GLOVE) IMPLANT
GLOVE BIOGEL PI IND STRL 7.0 (GLOVE) IMPLANT
GLOVE BIOGEL PI INDICATOR 6.5 (GLOVE) ×2
GLOVE BIOGEL PI INDICATOR 7.0 (GLOVE) ×2
LENS ALC ACRYL/TECN (Ophthalmic Related) ×2 IMPLANT
PAD ARMBOARD 7.5X6 YLW CONV (MISCELLANEOUS) ×2 IMPLANT
SYRINGE LUER LOK 1CC (MISCELLANEOUS) ×2 IMPLANT
TAPE SURG TRANSPORE 1 IN (GAUZE/BANDAGES/DRESSINGS) IMPLANT
TAPE SURGICAL TRANSPORE 1 IN (GAUZE/BANDAGES/DRESSINGS) ×2
WATER STERILE IRR 250ML POUR (IV SOLUTION) ×2 IMPLANT

## 2017-12-12 NOTE — Anesthesia Postprocedure Evaluation (Signed)
Anesthesia Post Note  Patient: Angelica Mccarthy  Procedure(s) Performed: CATARACT EXTRACTION PHACO AND INTRAOCULAR LENS PLACEMENT LEFT EYE (Left Eye)  Patient location during evaluation: Short Stay Anesthesia Type: MAC Level of consciousness: awake and alert and patient cooperative Pain management: satisfactory to patient Vital Signs Assessment: post-procedure vital signs reviewed and stable Respiratory status: spontaneous breathing Cardiovascular status: stable Postop Assessment: no apparent nausea or vomiting Anesthetic complications: no     Last Vitals:  Vitals:   12/12/17 1005 12/12/17 1010  BP: 117/67 (!) 96/58  Pulse:    Resp: (!) 21 15  Temp:    SpO2: 98% 96%    Last Pain:  Vitals:   12/12/17 0939  TempSrc: Oral  PainSc: 0-No pain                 Aaralyn Kil

## 2017-12-12 NOTE — Op Note (Signed)
Date of Admission: 12/12/2017  Date of Surgery: 12/12/2017  Pre-Op Dx: Cataract Left  Eye  Post-Op Dx: Senile Combined Cataract  Left  Eye,  Dx Code L89.373  Surgeon: Tonny Branch, M.D.  Assistants: None  Anesthesia: Topical with MAC  Indications: Painless, progressive loss of vision with compromise of daily activities.  Surgery: Cataract Extraction with Intraocular lens Implant Left Eye  Discription: The patient had dilating drops and viscous lidocaine placed into the Left eye in the pre-op holding area. After transfer to the operating room, a time out was performed. The patient was then prepped and draped. Beginning with a 87m blade a paracentesis port was made at the surgeon's 2 o'clock position. The anterior chamber was then filled with 1% non-preserved lidocaine. This was followed by filling the anterior chamber with Provisc.  A 2.449mkeratome blade was used to make a clear corneal incision at the temporal limbus.  A bent cystatome needle was used to create a continuous tear capsulotomy. Hydrodissection was performed with balanced salt solution on a Fine canula. The lens nucleus was then removed using the phacoemulsification handpiece. Residual cortex was removed with the I&A handpiece. The anterior chamber and capsular bag were refilled with Provisc. A posterior chamber intraocular lens was placed into the capsular bag with it's injector. The implant was positioned with the Kuglan hook. The Provisc was then removed from the anterior chamber and capsular bag with the I&A handpiece. Stromal hydration of the main incision and paracentesis port was performed with BSS on a Fine canula. The wounds were tested for leak which was negative. The patient tolerated the procedure well. There were no operative complications. The patient was then transferred to the recovery room in stable condition.  Complications: None  Specimen: None  EBL: None  Prosthetic device: J&J Technis, PCB00, power 17.5, SN  [id number]

## 2017-12-12 NOTE — Anesthesia Procedure Notes (Signed)
Procedure Name: MAC Date/Time: 12/12/2017 10:15 AM Performed by: Vista Deck, CRNA Pre-anesthesia Checklist: Patient identified, Emergency Drugs available, Suction available, Timeout performed and Patient being monitored Patient Re-evaluated:Patient Re-evaluated prior to induction Oxygen Delivery Method: Nasal Cannula

## 2017-12-12 NOTE — Transfer of Care (Signed)
Immediate Anesthesia Transfer of Care Note  Patient: Angelica Mccarthy  Procedure(s) Performed: CATARACT EXTRACTION PHACO AND INTRAOCULAR LENS PLACEMENT LEFT EYE (Left Eye)  Patient Location: Short Stay  Anesthesia Type:MAC  Level of Consciousness: awake, alert  and patient cooperative  Airway & Oxygen Therapy: Patient Spontanous Breathing  Post-op Assessment: Report given to RN and Post -op Vital signs reviewed and stable  Post vital signs: Reviewed and stable  Last Vitals:  Vitals:   12/12/17 1005 12/12/17 1010  BP: 117/67 (!) 96/58  Pulse:    Resp: (!) 21 15  Temp:    SpO2: 98% 96%    Last Pain:  Vitals:   12/12/17 0939  TempSrc: Oral  PainSc: 0-No pain      Patients Stated Pain Goal: 4 (31/54/00 8676)  Complications: No apparent anesthesia complications

## 2017-12-12 NOTE — H&P (Signed)
I have reviewed the H&P, the patient was re-examined, and I have identified no interval changes in medical condition and plan of care since the history and physical of record  

## 2017-12-12 NOTE — Anesthesia Preprocedure Evaluation (Signed)
Anesthesia Evaluation  Patient identified by MRN, date of birth, ID band Patient awake    Airway Mallampati: I  TM Distance: >3 FB Neck ROM: Full    Dental  (+) Teeth Intact   Pulmonary    Pulmonary exam normal        Cardiovascular hypertension, Pt. on home beta blockers and Pt. on medications Normal cardiovascular exam Rhythm:Regular Rate:Normal  Normal sinus rhythm Low voltage QRS  (jan 2019)   Neuro/Psych    GI/Hepatic GERD  Medicated,  Endo/Other  diabetes, Well Controlled, Type 2Hypothyroidism   Renal/GU      Musculoskeletal   Abdominal Normal abdominal exam  (+)   Peds  Hematology   Anesthesia Other Findings   Reproductive/Obstetrics                             Anesthesia Physical Anesthesia Plan  ASA: III  Anesthesia Plan: MAC   Post-op Pain Management:    Induction:   PONV Risk Score and Plan:   Airway Management Planned: Nasal Cannula  Additional Equipment:   Intra-op Plan:   Post-operative Plan:   Informed Consent: I have reviewed the patients History and Physical, chart, labs and discussed the procedure including the risks, benefits and alternatives for the proposed anesthesia with the patient or authorized representative who has indicated his/her understanding and acceptance.   Dental advisory given  Plan Discussed with: CRNA  Anesthesia Plan Comments:         Anesthesia Quick Evaluation

## 2017-12-13 ENCOUNTER — Encounter (HOSPITAL_COMMUNITY): Payer: Self-pay | Admitting: Ophthalmology

## 2017-12-19 DIAGNOSIS — M9904 Segmental and somatic dysfunction of sacral region: Secondary | ICD-10-CM | POA: Diagnosis not present

## 2017-12-19 DIAGNOSIS — M9905 Segmental and somatic dysfunction of pelvic region: Secondary | ICD-10-CM | POA: Diagnosis not present

## 2017-12-19 DIAGNOSIS — M5136 Other intervertebral disc degeneration, lumbar region: Secondary | ICD-10-CM | POA: Diagnosis not present

## 2017-12-19 DIAGNOSIS — M9903 Segmental and somatic dysfunction of lumbar region: Secondary | ICD-10-CM | POA: Diagnosis not present

## 2017-12-27 DIAGNOSIS — M9905 Segmental and somatic dysfunction of pelvic region: Secondary | ICD-10-CM | POA: Diagnosis not present

## 2017-12-27 DIAGNOSIS — M9904 Segmental and somatic dysfunction of sacral region: Secondary | ICD-10-CM | POA: Diagnosis not present

## 2017-12-27 DIAGNOSIS — M9903 Segmental and somatic dysfunction of lumbar region: Secondary | ICD-10-CM | POA: Diagnosis not present

## 2017-12-27 DIAGNOSIS — M5136 Other intervertebral disc degeneration, lumbar region: Secondary | ICD-10-CM | POA: Diagnosis not present

## 2017-12-30 DIAGNOSIS — M9905 Segmental and somatic dysfunction of pelvic region: Secondary | ICD-10-CM | POA: Diagnosis not present

## 2017-12-30 DIAGNOSIS — M9903 Segmental and somatic dysfunction of lumbar region: Secondary | ICD-10-CM | POA: Diagnosis not present

## 2017-12-30 DIAGNOSIS — M9904 Segmental and somatic dysfunction of sacral region: Secondary | ICD-10-CM | POA: Diagnosis not present

## 2017-12-30 DIAGNOSIS — M5136 Other intervertebral disc degeneration, lumbar region: Secondary | ICD-10-CM | POA: Diagnosis not present

## 2018-01-05 DIAGNOSIS — M9904 Segmental and somatic dysfunction of sacral region: Secondary | ICD-10-CM | POA: Diagnosis not present

## 2018-01-05 DIAGNOSIS — M9905 Segmental and somatic dysfunction of pelvic region: Secondary | ICD-10-CM | POA: Diagnosis not present

## 2018-01-05 DIAGNOSIS — M9903 Segmental and somatic dysfunction of lumbar region: Secondary | ICD-10-CM | POA: Diagnosis not present

## 2018-01-05 DIAGNOSIS — M5136 Other intervertebral disc degeneration, lumbar region: Secondary | ICD-10-CM | POA: Diagnosis not present

## 2018-01-11 DIAGNOSIS — M9905 Segmental and somatic dysfunction of pelvic region: Secondary | ICD-10-CM | POA: Diagnosis not present

## 2018-01-11 DIAGNOSIS — M9903 Segmental and somatic dysfunction of lumbar region: Secondary | ICD-10-CM | POA: Diagnosis not present

## 2018-01-11 DIAGNOSIS — M9904 Segmental and somatic dysfunction of sacral region: Secondary | ICD-10-CM | POA: Diagnosis not present

## 2018-01-11 DIAGNOSIS — M5136 Other intervertebral disc degeneration, lumbar region: Secondary | ICD-10-CM | POA: Diagnosis not present

## 2018-01-25 DIAGNOSIS — M9903 Segmental and somatic dysfunction of lumbar region: Secondary | ICD-10-CM | POA: Diagnosis not present

## 2018-01-25 DIAGNOSIS — M9904 Segmental and somatic dysfunction of sacral region: Secondary | ICD-10-CM | POA: Diagnosis not present

## 2018-01-25 DIAGNOSIS — M9905 Segmental and somatic dysfunction of pelvic region: Secondary | ICD-10-CM | POA: Diagnosis not present

## 2018-01-25 DIAGNOSIS — M5136 Other intervertebral disc degeneration, lumbar region: Secondary | ICD-10-CM | POA: Diagnosis not present

## 2018-02-03 DIAGNOSIS — M9904 Segmental and somatic dysfunction of sacral region: Secondary | ICD-10-CM | POA: Diagnosis not present

## 2018-02-03 DIAGNOSIS — M9903 Segmental and somatic dysfunction of lumbar region: Secondary | ICD-10-CM | POA: Diagnosis not present

## 2018-02-03 DIAGNOSIS — M5136 Other intervertebral disc degeneration, lumbar region: Secondary | ICD-10-CM | POA: Diagnosis not present

## 2018-02-03 DIAGNOSIS — M9905 Segmental and somatic dysfunction of pelvic region: Secondary | ICD-10-CM | POA: Diagnosis not present

## 2018-02-08 DIAGNOSIS — M5136 Other intervertebral disc degeneration, lumbar region: Secondary | ICD-10-CM | POA: Diagnosis not present

## 2018-02-08 DIAGNOSIS — M9904 Segmental and somatic dysfunction of sacral region: Secondary | ICD-10-CM | POA: Diagnosis not present

## 2018-02-08 DIAGNOSIS — M9903 Segmental and somatic dysfunction of lumbar region: Secondary | ICD-10-CM | POA: Diagnosis not present

## 2018-02-08 DIAGNOSIS — M9905 Segmental and somatic dysfunction of pelvic region: Secondary | ICD-10-CM | POA: Diagnosis not present

## 2018-02-22 DIAGNOSIS — M5136 Other intervertebral disc degeneration, lumbar region: Secondary | ICD-10-CM | POA: Diagnosis not present

## 2018-02-22 DIAGNOSIS — M9903 Segmental and somatic dysfunction of lumbar region: Secondary | ICD-10-CM | POA: Diagnosis not present

## 2018-02-22 DIAGNOSIS — M9904 Segmental and somatic dysfunction of sacral region: Secondary | ICD-10-CM | POA: Diagnosis not present

## 2018-02-22 DIAGNOSIS — M9905 Segmental and somatic dysfunction of pelvic region: Secondary | ICD-10-CM | POA: Diagnosis not present

## 2018-02-24 DIAGNOSIS — Z79899 Other long term (current) drug therapy: Secondary | ICD-10-CM | POA: Diagnosis not present

## 2018-02-24 DIAGNOSIS — E039 Hypothyroidism, unspecified: Secondary | ICD-10-CM | POA: Diagnosis not present

## 2018-02-24 DIAGNOSIS — E119 Type 2 diabetes mellitus without complications: Secondary | ICD-10-CM | POA: Diagnosis not present

## 2018-02-24 DIAGNOSIS — Z23 Encounter for immunization: Secondary | ICD-10-CM | POA: Diagnosis not present

## 2018-02-24 DIAGNOSIS — E7800 Pure hypercholesterolemia, unspecified: Secondary | ICD-10-CM | POA: Diagnosis not present

## 2018-02-24 DIAGNOSIS — K219 Gastro-esophageal reflux disease without esophagitis: Secondary | ICD-10-CM | POA: Diagnosis not present

## 2018-02-24 DIAGNOSIS — Z0001 Encounter for general adult medical examination with abnormal findings: Secondary | ICD-10-CM | POA: Diagnosis not present

## 2018-02-24 DIAGNOSIS — I10 Essential (primary) hypertension: Secondary | ICD-10-CM | POA: Diagnosis not present

## 2018-03-02 DIAGNOSIS — M9903 Segmental and somatic dysfunction of lumbar region: Secondary | ICD-10-CM | POA: Diagnosis not present

## 2018-03-02 DIAGNOSIS — M9905 Segmental and somatic dysfunction of pelvic region: Secondary | ICD-10-CM | POA: Diagnosis not present

## 2018-03-02 DIAGNOSIS — M9904 Segmental and somatic dysfunction of sacral region: Secondary | ICD-10-CM | POA: Diagnosis not present

## 2018-03-02 DIAGNOSIS — M5136 Other intervertebral disc degeneration, lumbar region: Secondary | ICD-10-CM | POA: Diagnosis not present

## 2018-03-06 DIAGNOSIS — M9905 Segmental and somatic dysfunction of pelvic region: Secondary | ICD-10-CM | POA: Diagnosis not present

## 2018-03-06 DIAGNOSIS — M5136 Other intervertebral disc degeneration, lumbar region: Secondary | ICD-10-CM | POA: Diagnosis not present

## 2018-03-06 DIAGNOSIS — M9903 Segmental and somatic dysfunction of lumbar region: Secondary | ICD-10-CM | POA: Diagnosis not present

## 2018-03-06 DIAGNOSIS — M9904 Segmental and somatic dysfunction of sacral region: Secondary | ICD-10-CM | POA: Diagnosis not present

## 2018-03-09 DIAGNOSIS — M9903 Segmental and somatic dysfunction of lumbar region: Secondary | ICD-10-CM | POA: Diagnosis not present

## 2018-03-09 DIAGNOSIS — M5136 Other intervertebral disc degeneration, lumbar region: Secondary | ICD-10-CM | POA: Diagnosis not present

## 2018-03-09 DIAGNOSIS — M9904 Segmental and somatic dysfunction of sacral region: Secondary | ICD-10-CM | POA: Diagnosis not present

## 2018-03-09 DIAGNOSIS — M9905 Segmental and somatic dysfunction of pelvic region: Secondary | ICD-10-CM | POA: Diagnosis not present

## 2018-03-21 DIAGNOSIS — M9905 Segmental and somatic dysfunction of pelvic region: Secondary | ICD-10-CM | POA: Diagnosis not present

## 2018-03-21 DIAGNOSIS — M9904 Segmental and somatic dysfunction of sacral region: Secondary | ICD-10-CM | POA: Diagnosis not present

## 2018-03-21 DIAGNOSIS — M5136 Other intervertebral disc degeneration, lumbar region: Secondary | ICD-10-CM | POA: Diagnosis not present

## 2018-03-21 DIAGNOSIS — M9903 Segmental and somatic dysfunction of lumbar region: Secondary | ICD-10-CM | POA: Diagnosis not present

## 2018-03-28 DIAGNOSIS — M5136 Other intervertebral disc degeneration, lumbar region: Secondary | ICD-10-CM | POA: Diagnosis not present

## 2018-03-28 DIAGNOSIS — M9903 Segmental and somatic dysfunction of lumbar region: Secondary | ICD-10-CM | POA: Diagnosis not present

## 2018-03-28 DIAGNOSIS — M9904 Segmental and somatic dysfunction of sacral region: Secondary | ICD-10-CM | POA: Diagnosis not present

## 2018-03-28 DIAGNOSIS — M9905 Segmental and somatic dysfunction of pelvic region: Secondary | ICD-10-CM | POA: Diagnosis not present

## 2018-04-04 DIAGNOSIS — M5136 Other intervertebral disc degeneration, lumbar region: Secondary | ICD-10-CM | POA: Diagnosis not present

## 2018-04-04 DIAGNOSIS — M9904 Segmental and somatic dysfunction of sacral region: Secondary | ICD-10-CM | POA: Diagnosis not present

## 2018-04-04 DIAGNOSIS — M9903 Segmental and somatic dysfunction of lumbar region: Secondary | ICD-10-CM | POA: Diagnosis not present

## 2018-04-04 DIAGNOSIS — M9905 Segmental and somatic dysfunction of pelvic region: Secondary | ICD-10-CM | POA: Diagnosis not present

## 2018-04-07 ENCOUNTER — Encounter: Payer: Self-pay | Admitting: Internal Medicine

## 2018-04-11 DIAGNOSIS — M9904 Segmental and somatic dysfunction of sacral region: Secondary | ICD-10-CM | POA: Diagnosis not present

## 2018-04-11 DIAGNOSIS — M9905 Segmental and somatic dysfunction of pelvic region: Secondary | ICD-10-CM | POA: Diagnosis not present

## 2018-04-11 DIAGNOSIS — M5136 Other intervertebral disc degeneration, lumbar region: Secondary | ICD-10-CM | POA: Diagnosis not present

## 2018-04-11 DIAGNOSIS — M9903 Segmental and somatic dysfunction of lumbar region: Secondary | ICD-10-CM | POA: Diagnosis not present

## 2018-04-18 DIAGNOSIS — I25118 Atherosclerotic heart disease of native coronary artery with other forms of angina pectoris: Secondary | ICD-10-CM | POA: Diagnosis not present

## 2018-04-18 DIAGNOSIS — I10 Essential (primary) hypertension: Secondary | ICD-10-CM | POA: Diagnosis not present

## 2018-04-18 DIAGNOSIS — Z79899 Other long term (current) drug therapy: Secondary | ICD-10-CM | POA: Diagnosis not present

## 2018-04-18 DIAGNOSIS — E118 Type 2 diabetes mellitus with unspecified complications: Secondary | ICD-10-CM | POA: Diagnosis not present

## 2018-04-18 DIAGNOSIS — I209 Angina pectoris, unspecified: Secondary | ICD-10-CM | POA: Diagnosis not present

## 2018-04-18 DIAGNOSIS — K210 Gastro-esophageal reflux disease with esophagitis: Secondary | ICD-10-CM | POA: Diagnosis not present

## 2018-04-20 DIAGNOSIS — M9904 Segmental and somatic dysfunction of sacral region: Secondary | ICD-10-CM | POA: Diagnosis not present

## 2018-04-20 DIAGNOSIS — M5136 Other intervertebral disc degeneration, lumbar region: Secondary | ICD-10-CM | POA: Diagnosis not present

## 2018-04-20 DIAGNOSIS — M9905 Segmental and somatic dysfunction of pelvic region: Secondary | ICD-10-CM | POA: Diagnosis not present

## 2018-04-20 DIAGNOSIS — M9903 Segmental and somatic dysfunction of lumbar region: Secondary | ICD-10-CM | POA: Diagnosis not present

## 2018-04-26 DIAGNOSIS — M5136 Other intervertebral disc degeneration, lumbar region: Secondary | ICD-10-CM | POA: Diagnosis not present

## 2018-04-26 DIAGNOSIS — M9905 Segmental and somatic dysfunction of pelvic region: Secondary | ICD-10-CM | POA: Diagnosis not present

## 2018-04-26 DIAGNOSIS — M9904 Segmental and somatic dysfunction of sacral region: Secondary | ICD-10-CM | POA: Diagnosis not present

## 2018-04-26 DIAGNOSIS — M9903 Segmental and somatic dysfunction of lumbar region: Secondary | ICD-10-CM | POA: Diagnosis not present

## 2018-04-27 DIAGNOSIS — Z803 Family history of malignant neoplasm of breast: Secondary | ICD-10-CM | POA: Diagnosis not present

## 2018-04-27 DIAGNOSIS — Z808 Family history of malignant neoplasm of other organs or systems: Secondary | ICD-10-CM | POA: Diagnosis not present

## 2018-05-02 DIAGNOSIS — M9904 Segmental and somatic dysfunction of sacral region: Secondary | ICD-10-CM | POA: Diagnosis not present

## 2018-05-02 DIAGNOSIS — M9905 Segmental and somatic dysfunction of pelvic region: Secondary | ICD-10-CM | POA: Diagnosis not present

## 2018-05-02 DIAGNOSIS — M5136 Other intervertebral disc degeneration, lumbar region: Secondary | ICD-10-CM | POA: Diagnosis not present

## 2018-05-02 DIAGNOSIS — M9903 Segmental and somatic dysfunction of lumbar region: Secondary | ICD-10-CM | POA: Diagnosis not present

## 2018-05-10 DIAGNOSIS — M9903 Segmental and somatic dysfunction of lumbar region: Secondary | ICD-10-CM | POA: Diagnosis not present

## 2018-05-10 DIAGNOSIS — M5136 Other intervertebral disc degeneration, lumbar region: Secondary | ICD-10-CM | POA: Diagnosis not present

## 2018-05-10 DIAGNOSIS — M9905 Segmental and somatic dysfunction of pelvic region: Secondary | ICD-10-CM | POA: Diagnosis not present

## 2018-05-10 DIAGNOSIS — M9904 Segmental and somatic dysfunction of sacral region: Secondary | ICD-10-CM | POA: Diagnosis not present

## 2018-05-16 DIAGNOSIS — M9904 Segmental and somatic dysfunction of sacral region: Secondary | ICD-10-CM | POA: Diagnosis not present

## 2018-05-16 DIAGNOSIS — M9905 Segmental and somatic dysfunction of pelvic region: Secondary | ICD-10-CM | POA: Diagnosis not present

## 2018-05-16 DIAGNOSIS — M5136 Other intervertebral disc degeneration, lumbar region: Secondary | ICD-10-CM | POA: Diagnosis not present

## 2018-05-16 DIAGNOSIS — M9903 Segmental and somatic dysfunction of lumbar region: Secondary | ICD-10-CM | POA: Diagnosis not present

## 2018-05-17 DIAGNOSIS — Z1231 Encounter for screening mammogram for malignant neoplasm of breast: Secondary | ICD-10-CM | POA: Diagnosis not present

## 2018-05-30 DIAGNOSIS — M9905 Segmental and somatic dysfunction of pelvic region: Secondary | ICD-10-CM | POA: Diagnosis not present

## 2018-05-30 DIAGNOSIS — M9903 Segmental and somatic dysfunction of lumbar region: Secondary | ICD-10-CM | POA: Diagnosis not present

## 2018-05-30 DIAGNOSIS — M5136 Other intervertebral disc degeneration, lumbar region: Secondary | ICD-10-CM | POA: Diagnosis not present

## 2018-05-30 DIAGNOSIS — M9904 Segmental and somatic dysfunction of sacral region: Secondary | ICD-10-CM | POA: Diagnosis not present

## 2018-06-07 ENCOUNTER — Ambulatory Visit (AMBULATORY_SURGERY_CENTER): Payer: Self-pay | Admitting: *Deleted

## 2018-06-07 VITALS — Ht 62.0 in | Wt 218.0 lb

## 2018-06-07 DIAGNOSIS — M9902 Segmental and somatic dysfunction of thoracic region: Secondary | ICD-10-CM | POA: Diagnosis not present

## 2018-06-07 DIAGNOSIS — M5136 Other intervertebral disc degeneration, lumbar region: Secondary | ICD-10-CM | POA: Diagnosis not present

## 2018-06-07 DIAGNOSIS — M5134 Other intervertebral disc degeneration, thoracic region: Secondary | ICD-10-CM | POA: Diagnosis not present

## 2018-06-07 DIAGNOSIS — M9903 Segmental and somatic dysfunction of lumbar region: Secondary | ICD-10-CM | POA: Diagnosis not present

## 2018-06-07 DIAGNOSIS — R195 Other fecal abnormalities: Secondary | ICD-10-CM

## 2018-06-07 MED ORDER — NA SULFATE-K SULFATE-MG SULF 17.5-3.13-1.6 GM/177ML PO SOLN: 1.0000 | Freq: Once | ORAL | 0 refills | Status: AC

## 2018-06-07 NOTE — Progress Notes (Signed)
No egg or soy allergy known to patient  No issues with past sedation with any surgeries  or procedures, no intubation problems  No diet pills per patient No home 02 use per patient  No blood thinners per patient  Pt denies issues with constipation  No A fib or A flutter  EMMI video sent to pt's e mail  

## 2018-06-15 DIAGNOSIS — M9903 Segmental and somatic dysfunction of lumbar region: Secondary | ICD-10-CM | POA: Diagnosis not present

## 2018-06-15 DIAGNOSIS — M9902 Segmental and somatic dysfunction of thoracic region: Secondary | ICD-10-CM | POA: Diagnosis not present

## 2018-06-15 DIAGNOSIS — M5136 Other intervertebral disc degeneration, lumbar region: Secondary | ICD-10-CM | POA: Diagnosis not present

## 2018-06-15 DIAGNOSIS — M5134 Other intervertebral disc degeneration, thoracic region: Secondary | ICD-10-CM | POA: Diagnosis not present

## 2018-06-19 DIAGNOSIS — M9902 Segmental and somatic dysfunction of thoracic region: Secondary | ICD-10-CM | POA: Diagnosis not present

## 2018-06-19 DIAGNOSIS — M9903 Segmental and somatic dysfunction of lumbar region: Secondary | ICD-10-CM | POA: Diagnosis not present

## 2018-06-19 DIAGNOSIS — M5134 Other intervertebral disc degeneration, thoracic region: Secondary | ICD-10-CM | POA: Diagnosis not present

## 2018-06-19 DIAGNOSIS — M5136 Other intervertebral disc degeneration, lumbar region: Secondary | ICD-10-CM | POA: Diagnosis not present

## 2018-06-20 DIAGNOSIS — M9902 Segmental and somatic dysfunction of thoracic region: Secondary | ICD-10-CM | POA: Diagnosis not present

## 2018-06-20 DIAGNOSIS — M5134 Other intervertebral disc degeneration, thoracic region: Secondary | ICD-10-CM | POA: Diagnosis not present

## 2018-06-20 DIAGNOSIS — M9903 Segmental and somatic dysfunction of lumbar region: Secondary | ICD-10-CM | POA: Diagnosis not present

## 2018-06-20 DIAGNOSIS — M5136 Other intervertebral disc degeneration, lumbar region: Secondary | ICD-10-CM | POA: Diagnosis not present

## 2018-06-21 ENCOUNTER — Ambulatory Visit (AMBULATORY_SURGERY_CENTER): Payer: Medicare Other | Admitting: Internal Medicine

## 2018-06-21 ENCOUNTER — Encounter: Payer: Self-pay | Admitting: Internal Medicine

## 2018-06-21 VITALS — BP 112/70 | HR 53 | Temp 98.6°F | Resp 18

## 2018-06-21 DIAGNOSIS — D120 Benign neoplasm of cecum: Secondary | ICD-10-CM

## 2018-06-21 DIAGNOSIS — R195 Other fecal abnormalities: Secondary | ICD-10-CM | POA: Diagnosis not present

## 2018-06-21 DIAGNOSIS — K6289 Other specified diseases of anus and rectum: Secondary | ICD-10-CM | POA: Diagnosis not present

## 2018-06-21 DIAGNOSIS — I10 Essential (primary) hypertension: Secondary | ICD-10-CM | POA: Diagnosis not present

## 2018-06-21 DIAGNOSIS — E119 Type 2 diabetes mellitus without complications: Secondary | ICD-10-CM | POA: Diagnosis not present

## 2018-06-21 DIAGNOSIS — K620 Anal polyp: Secondary | ICD-10-CM

## 2018-06-21 DIAGNOSIS — K219 Gastro-esophageal reflux disease without esophagitis: Secondary | ICD-10-CM | POA: Diagnosis not present

## 2018-06-21 MED ORDER — SODIUM CHLORIDE 0.9 % IV SOLN: 500.0000 mL | Freq: Once | INTRAVENOUS | Status: DC

## 2018-06-21 NOTE — Progress Notes (Signed)
Called to room to assist during endoscopic procedure.  Patient ID and intended procedure confirmed with present staff. Received instructions for my participation in the procedure from the performing physician.  

## 2018-06-21 NOTE — Op Note (Signed)
[facility] Patient Name: Angelica Mccarthy Procedure Date: 06/21/2018 11:02 AM MRN: [id number] Endoscopist: Jerene Bears , MD Age: 67 Referring MD:  Date of Birth: [birth year] Gender: Female Account #: [id number] Procedure:                Colonoscopy Indications:              This is the patient's first colonoscopy, Positive                            fecal immunochemical test Medicines:                Monitored Anesthesia Care Procedure:                Pre-Anesthesia Assessment:                           - Prior to the procedure, a History and Physical                            was performed, and patient medications and                            allergies were reviewed. The patient's tolerance of                            previous anesthesia was also reviewed. The risks                            and benefits of the procedure and the sedation                            options and risks were discussed with the patient.                            All questions were answered, and informed consent                            was obtained. Prior Anticoagulants: The patient has                            taken no previous anticoagulant or antiplatelet                            agents. ASA Grade Assessment: III - A patient with                            severe systemic disease. After reviewing the risks                            and benefits, the patient was deemed in                            satisfactory condition to undergo the procedure.  After obtaining informed consent, the colonoscope                            was passed under direct vision. Throughout the                            procedure, the patient's blood pressure, pulse, and                            oxygen saturations were monitored continuously. The                            Colonoscope was introduced through the anus and                            advanced to the cecum, identified  by appendiceal                            orifice and ileocecal valve. The colonoscopy was                            performed without difficulty. The patient tolerated                            the procedure well. The quality of the bowel                            preparation was good. The ileocecal valve,                            appendiceal orifice, and rectum were photographed. Scope In: 11:23:36 AM Scope Out: 11:40:11 AM Scope Withdrawal Time: 0 hours 14 minutes 24 seconds  Total Procedure Duration: 0 hours 16 minutes 35 seconds  Findings:                 Skin tags were found on perianal exam.                           The digital rectal exam findings include palpable                            anal nodules/polyps, one firm.                           A 2 mm polyp was found in the ileocecal valve. The                            polyp was sessile. The polyp was removed with a                            cold biopsy forceps. Resection and retrieval were                            complete.  Multiple polypoid lesions were found at the anal                            canal, 1 approximately 10-12 mm. Multiple biopsies                            were obtained with cold forceps for histology in a                            targeted manner to exclude anal malignancy.                           External and internal hemorrhoids were found during                            retroflexion and during digital exam. The                            hemorrhoids were small. Complications:            No immediate complications. Estimated Blood Loss:     Estimated blood loss was minimal. Impression:               - One 2 mm polyp at the ileocecal valve, removed                            with a cold biopsy forceps. Resected and retrieved.                           - Anal polypoid lesions; multiple biopsies.                           - External and internal  hemorrhoids. Recommendation:           - Patient has a contact number available for                            emergencies. The signs and symptoms of potential                            delayed complications were discussed with the                            patient. Return to normal activities tomorrow.                            Written discharge instructions were provided to the                            patient.                           - Resume previous diet.                           - Continue present medications.                           -  Await pathology results.                           - Repeat colonoscopy is recommended. The                            colonoscopy date will be determined after pathology                            results from today's exam become available for                            review. Jerene Bears, MD 06/21/2018 11:48:53 AM This report has been signed electronically.

## 2018-06-21 NOTE — Progress Notes (Signed)
Called to room to assist during endoscopic procedure.  Patient ID and intended procedure confirmed with present staff. Received instructions for my participation in the procedure from the performing physician.  Per Dr. Hilarie Fredrickson send path [facility]. maw

## 2018-06-21 NOTE — Patient Instructions (Signed)
YOU HAD AN ENDOSCOPIC PROCEDURE TODAY AT THE [facility]:   Refer to the procedure report that was given to you for any specific questions about what was found during the examination.  If the procedure report does not answer your questions, please call your gastroenterologist to clarify.  If you requested that your care partner not be given the details of your procedure findings, then the procedure report has been included in a sealed envelope for you to review at your convenience later.  YOU SHOULD EXPECT: Some feelings of bloating in the abdomen. Passage of more gas than usual.  Walking can help get rid of the air that was put into your GI tract during the procedure and reduce the bloating. If you had a lower endoscopy (such as a colonoscopy or flexible sigmoidoscopy) you may notice spotting of blood in your stool or on the toilet paper. If you underwent a bowel prep for your procedure, you may not have a normal bowel movement for a few days.  Please Note:  You might notice some irritation and congestion in your nose or some drainage.  This is from the oxygen used during your procedure.  There is no need for concern and it should clear up in a day or so.  SYMPTOMS TO REPORT IMMEDIATELY:   Following lower endoscopy (colonoscopy or flexible sigmoidoscopy):  Excessive amounts of blood in the stool  Significant tenderness or worsening of abdominal pains  Swelling of the abdomen that is new, acute  Fever of 100F or higher  Please see handouts given to you on Polyps and Hemorrhoids.  For urgent or emergent issues, a gastroenterologist can be reached at any hour by calling [phone].   DIET:  We do recommend a small meal at first, but then you may proceed to your regular diet.  Drink plenty of fluids but you should avoid alcoholic beverages for 24 hours.  ACTIVITY:  You should plan to take it easy for the rest of today and you should NOT DRIVE or use heavy machinery until tomorrow  (because of the sedation medicines used during the test).    FOLLOW UP: Our staff will call the number listed on your records the next business day following your procedure to check on you and address any questions or concerns that you may have regarding the information given to you following your procedure. If we do not reach you, we will leave a message.  However, if you are feeling well and you are not experiencing any problems, there is no need to return our call.  We will assume that you have returned to your regular daily activities without incident.  If any biopsies were taken you will be contacted by phone or by letter within the next 1-3 weeks.  Please call us at [phone] if you have not heard about the biopsies in 3 weeks.    SIGNATURES/CONFIDENTIALITY: You and/or your care partner have signed paperwork which will be entered into your electronic medical record.  These signatures attest to the fact that that the information above on your After Visit Summary has been reviewed and is understood.  Full responsibility of the confidentiality of this discharge information lies with you and/or your care-partner.  Thank you for letting us take care of your healthcare needs today. 

## 2018-06-21 NOTE — Progress Notes (Signed)
To PACU, vss patent aw report to rn 

## 2018-06-22 ENCOUNTER — Telehealth: Payer: Self-pay | Admitting: *Deleted

## 2018-06-22 NOTE — Telephone Encounter (Signed)
  Follow up Call-  Call back number 06/21/2018  Post procedure Call Back phone  # [phone]  Permission to leave phone message Yes  Some recent data might be hidden     Patient questions:  Do you have a fever, pain , or abdominal swelling? No. Pain Score  0 *  Have you tolerated food without any problems? Yes.    Have you been able to return to your normal activities? Yes.    Do you have any questions about your discharge instructions: Diet   No. Medications  No. Follow up visit  No.  Do you have questions or concerns about your Care? No.  Actions: * If pain score is 4 or above: No action needed, pain <4.

## 2018-06-27 DIAGNOSIS — M9902 Segmental and somatic dysfunction of thoracic region: Secondary | ICD-10-CM | POA: Diagnosis not present

## 2018-06-27 DIAGNOSIS — M9903 Segmental and somatic dysfunction of lumbar region: Secondary | ICD-10-CM | POA: Diagnosis not present

## 2018-06-27 DIAGNOSIS — M5136 Other intervertebral disc degeneration, lumbar region: Secondary | ICD-10-CM | POA: Diagnosis not present

## 2018-06-27 DIAGNOSIS — M5134 Other intervertebral disc degeneration, thoracic region: Secondary | ICD-10-CM | POA: Diagnosis not present

## 2018-07-06 DIAGNOSIS — M5136 Other intervertebral disc degeneration, lumbar region: Secondary | ICD-10-CM | POA: Diagnosis not present

## 2018-07-06 DIAGNOSIS — M9902 Segmental and somatic dysfunction of thoracic region: Secondary | ICD-10-CM | POA: Diagnosis not present

## 2018-07-06 DIAGNOSIS — M5134 Other intervertebral disc degeneration, thoracic region: Secondary | ICD-10-CM | POA: Diagnosis not present

## 2018-07-06 DIAGNOSIS — M9903 Segmental and somatic dysfunction of lumbar region: Secondary | ICD-10-CM | POA: Diagnosis not present

## 2018-07-18 DIAGNOSIS — M9902 Segmental and somatic dysfunction of thoracic region: Secondary | ICD-10-CM | POA: Diagnosis not present

## 2018-07-18 DIAGNOSIS — M9903 Segmental and somatic dysfunction of lumbar region: Secondary | ICD-10-CM | POA: Diagnosis not present

## 2018-07-18 DIAGNOSIS — M5134 Other intervertebral disc degeneration, thoracic region: Secondary | ICD-10-CM | POA: Diagnosis not present

## 2018-07-18 DIAGNOSIS — M5136 Other intervertebral disc degeneration, lumbar region: Secondary | ICD-10-CM | POA: Diagnosis not present

## 2018-07-24 DIAGNOSIS — M9901 Segmental and somatic dysfunction of cervical region: Secondary | ICD-10-CM | POA: Diagnosis not present

## 2018-07-24 DIAGNOSIS — M50322 Other cervical disc degeneration at C5-C6 level: Secondary | ICD-10-CM | POA: Diagnosis not present

## 2018-07-24 DIAGNOSIS — M9902 Segmental and somatic dysfunction of thoracic region: Secondary | ICD-10-CM | POA: Diagnosis not present

## 2018-07-24 DIAGNOSIS — M5134 Other intervertebral disc degeneration, thoracic region: Secondary | ICD-10-CM | POA: Diagnosis not present

## 2018-07-26 DIAGNOSIS — M50322 Other cervical disc degeneration at C5-C6 level: Secondary | ICD-10-CM | POA: Diagnosis not present

## 2018-07-26 DIAGNOSIS — M9901 Segmental and somatic dysfunction of cervical region: Secondary | ICD-10-CM | POA: Diagnosis not present

## 2018-07-26 DIAGNOSIS — M9902 Segmental and somatic dysfunction of thoracic region: Secondary | ICD-10-CM | POA: Diagnosis not present

## 2018-07-26 DIAGNOSIS — M5134 Other intervertebral disc degeneration, thoracic region: Secondary | ICD-10-CM | POA: Diagnosis not present

## 2018-08-02 DIAGNOSIS — M62830 Muscle spasm of back: Secondary | ICD-10-CM | POA: Diagnosis not present

## 2018-08-02 DIAGNOSIS — M9903 Segmental and somatic dysfunction of lumbar region: Secondary | ICD-10-CM | POA: Diagnosis not present

## 2018-08-02 DIAGNOSIS — M9902 Segmental and somatic dysfunction of thoracic region: Secondary | ICD-10-CM | POA: Diagnosis not present

## 2018-08-02 DIAGNOSIS — M546 Pain in thoracic spine: Secondary | ICD-10-CM | POA: Diagnosis not present

## 2018-08-03 DIAGNOSIS — M9902 Segmental and somatic dysfunction of thoracic region: Secondary | ICD-10-CM | POA: Diagnosis not present

## 2018-08-03 DIAGNOSIS — M62830 Muscle spasm of back: Secondary | ICD-10-CM | POA: Diagnosis not present

## 2018-08-03 DIAGNOSIS — M546 Pain in thoracic spine: Secondary | ICD-10-CM | POA: Diagnosis not present

## 2018-08-03 DIAGNOSIS — M9903 Segmental and somatic dysfunction of lumbar region: Secondary | ICD-10-CM | POA: Diagnosis not present

## 2018-08-07 DIAGNOSIS — M546 Pain in thoracic spine: Secondary | ICD-10-CM | POA: Diagnosis not present

## 2018-08-07 DIAGNOSIS — M9902 Segmental and somatic dysfunction of thoracic region: Secondary | ICD-10-CM | POA: Diagnosis not present

## 2018-08-07 DIAGNOSIS — M9903 Segmental and somatic dysfunction of lumbar region: Secondary | ICD-10-CM | POA: Diagnosis not present

## 2018-08-07 DIAGNOSIS — M62830 Muscle spasm of back: Secondary | ICD-10-CM | POA: Diagnosis not present

## 2018-08-10 DIAGNOSIS — M9901 Segmental and somatic dysfunction of cervical region: Secondary | ICD-10-CM | POA: Diagnosis not present

## 2018-08-10 DIAGNOSIS — M50322 Other cervical disc degeneration at C5-C6 level: Secondary | ICD-10-CM | POA: Diagnosis not present

## 2018-08-10 DIAGNOSIS — M5134 Other intervertebral disc degeneration, thoracic region: Secondary | ICD-10-CM | POA: Diagnosis not present

## 2018-08-10 DIAGNOSIS — M9902 Segmental and somatic dysfunction of thoracic region: Secondary | ICD-10-CM | POA: Diagnosis not present

## 2018-08-21 DIAGNOSIS — M5134 Other intervertebral disc degeneration, thoracic region: Secondary | ICD-10-CM | POA: Diagnosis not present

## 2018-08-21 DIAGNOSIS — M9901 Segmental and somatic dysfunction of cervical region: Secondary | ICD-10-CM | POA: Diagnosis not present

## 2018-08-21 DIAGNOSIS — M50322 Other cervical disc degeneration at C5-C6 level: Secondary | ICD-10-CM | POA: Diagnosis not present

## 2018-08-21 DIAGNOSIS — M9902 Segmental and somatic dysfunction of thoracic region: Secondary | ICD-10-CM | POA: Diagnosis not present

## 2018-08-25 DIAGNOSIS — Z23 Encounter for immunization: Secondary | ICD-10-CM | POA: Diagnosis not present

## 2018-08-28 DIAGNOSIS — M5136 Other intervertebral disc degeneration, lumbar region: Secondary | ICD-10-CM | POA: Diagnosis not present

## 2018-08-28 DIAGNOSIS — M9905 Segmental and somatic dysfunction of pelvic region: Secondary | ICD-10-CM | POA: Diagnosis not present

## 2018-08-28 DIAGNOSIS — M9904 Segmental and somatic dysfunction of sacral region: Secondary | ICD-10-CM | POA: Diagnosis not present

## 2018-08-28 DIAGNOSIS — M9903 Segmental and somatic dysfunction of lumbar region: Secondary | ICD-10-CM | POA: Diagnosis not present

## 2018-08-29 DIAGNOSIS — M9904 Segmental and somatic dysfunction of sacral region: Secondary | ICD-10-CM | POA: Diagnosis not present

## 2018-08-29 DIAGNOSIS — M5136 Other intervertebral disc degeneration, lumbar region: Secondary | ICD-10-CM | POA: Diagnosis not present

## 2018-08-29 DIAGNOSIS — M9903 Segmental and somatic dysfunction of lumbar region: Secondary | ICD-10-CM | POA: Diagnosis not present

## 2018-08-29 DIAGNOSIS — M9905 Segmental and somatic dysfunction of pelvic region: Secondary | ICD-10-CM | POA: Diagnosis not present

## 2018-08-30 DIAGNOSIS — M9904 Segmental and somatic dysfunction of sacral region: Secondary | ICD-10-CM | POA: Diagnosis not present

## 2018-08-30 DIAGNOSIS — M9905 Segmental and somatic dysfunction of pelvic region: Secondary | ICD-10-CM | POA: Diagnosis not present

## 2018-08-30 DIAGNOSIS — M9903 Segmental and somatic dysfunction of lumbar region: Secondary | ICD-10-CM | POA: Diagnosis not present

## 2018-08-30 DIAGNOSIS — M5136 Other intervertebral disc degeneration, lumbar region: Secondary | ICD-10-CM | POA: Diagnosis not present

## 2018-08-31 DIAGNOSIS — E7800 Pure hypercholesterolemia, unspecified: Secondary | ICD-10-CM | POA: Diagnosis not present

## 2018-08-31 DIAGNOSIS — I10 Essential (primary) hypertension: Secondary | ICD-10-CM | POA: Diagnosis not present

## 2018-08-31 DIAGNOSIS — E119 Type 2 diabetes mellitus without complications: Secondary | ICD-10-CM | POA: Diagnosis not present

## 2018-08-31 DIAGNOSIS — E039 Hypothyroidism, unspecified: Secondary | ICD-10-CM | POA: Diagnosis not present

## 2018-09-06 DIAGNOSIS — M5136 Other intervertebral disc degeneration, lumbar region: Secondary | ICD-10-CM | POA: Diagnosis not present

## 2018-09-06 DIAGNOSIS — M9903 Segmental and somatic dysfunction of lumbar region: Secondary | ICD-10-CM | POA: Diagnosis not present

## 2018-09-06 DIAGNOSIS — M9905 Segmental and somatic dysfunction of pelvic region: Secondary | ICD-10-CM | POA: Diagnosis not present

## 2018-09-06 DIAGNOSIS — M9904 Segmental and somatic dysfunction of sacral region: Secondary | ICD-10-CM | POA: Diagnosis not present

## 2018-09-13 DIAGNOSIS — M9904 Segmental and somatic dysfunction of sacral region: Secondary | ICD-10-CM | POA: Diagnosis not present

## 2018-09-13 DIAGNOSIS — M9905 Segmental and somatic dysfunction of pelvic region: Secondary | ICD-10-CM | POA: Diagnosis not present

## 2018-09-13 DIAGNOSIS — M5136 Other intervertebral disc degeneration, lumbar region: Secondary | ICD-10-CM | POA: Diagnosis not present

## 2018-09-13 DIAGNOSIS — M9903 Segmental and somatic dysfunction of lumbar region: Secondary | ICD-10-CM | POA: Diagnosis not present

## 2018-09-26 DIAGNOSIS — M9905 Segmental and somatic dysfunction of pelvic region: Secondary | ICD-10-CM | POA: Diagnosis not present

## 2018-09-26 DIAGNOSIS — M9903 Segmental and somatic dysfunction of lumbar region: Secondary | ICD-10-CM | POA: Diagnosis not present

## 2018-09-26 DIAGNOSIS — M9904 Segmental and somatic dysfunction of sacral region: Secondary | ICD-10-CM | POA: Diagnosis not present

## 2018-09-26 DIAGNOSIS — M5136 Other intervertebral disc degeneration, lumbar region: Secondary | ICD-10-CM | POA: Diagnosis not present

## 2018-10-02 DIAGNOSIS — M5136 Other intervertebral disc degeneration, lumbar region: Secondary | ICD-10-CM | POA: Diagnosis not present

## 2018-10-02 DIAGNOSIS — M9904 Segmental and somatic dysfunction of sacral region: Secondary | ICD-10-CM | POA: Diagnosis not present

## 2018-10-02 DIAGNOSIS — M9905 Segmental and somatic dysfunction of pelvic region: Secondary | ICD-10-CM | POA: Diagnosis not present

## 2018-10-02 DIAGNOSIS — M9903 Segmental and somatic dysfunction of lumbar region: Secondary | ICD-10-CM | POA: Diagnosis not present

## 2018-10-05 DIAGNOSIS — M9903 Segmental and somatic dysfunction of lumbar region: Secondary | ICD-10-CM | POA: Diagnosis not present

## 2018-10-05 DIAGNOSIS — M5136 Other intervertebral disc degeneration, lumbar region: Secondary | ICD-10-CM | POA: Diagnosis not present

## 2018-10-05 DIAGNOSIS — M9904 Segmental and somatic dysfunction of sacral region: Secondary | ICD-10-CM | POA: Diagnosis not present

## 2018-10-05 DIAGNOSIS — M9905 Segmental and somatic dysfunction of pelvic region: Secondary | ICD-10-CM | POA: Diagnosis not present

## 2018-10-09 DIAGNOSIS — M9904 Segmental and somatic dysfunction of sacral region: Secondary | ICD-10-CM | POA: Diagnosis not present

## 2018-10-09 DIAGNOSIS — M9903 Segmental and somatic dysfunction of lumbar region: Secondary | ICD-10-CM | POA: Diagnosis not present

## 2018-10-09 DIAGNOSIS — M9905 Segmental and somatic dysfunction of pelvic region: Secondary | ICD-10-CM | POA: Diagnosis not present

## 2018-10-09 DIAGNOSIS — M5136 Other intervertebral disc degeneration, lumbar region: Secondary | ICD-10-CM | POA: Diagnosis not present

## 2018-10-16 DIAGNOSIS — M5136 Other intervertebral disc degeneration, lumbar region: Secondary | ICD-10-CM | POA: Diagnosis not present

## 2018-10-16 DIAGNOSIS — M9904 Segmental and somatic dysfunction of sacral region: Secondary | ICD-10-CM | POA: Diagnosis not present

## 2018-10-16 DIAGNOSIS — M9903 Segmental and somatic dysfunction of lumbar region: Secondary | ICD-10-CM | POA: Diagnosis not present

## 2018-10-16 DIAGNOSIS — M9905 Segmental and somatic dysfunction of pelvic region: Secondary | ICD-10-CM | POA: Diagnosis not present

## 2018-10-19 DIAGNOSIS — M5136 Other intervertebral disc degeneration, lumbar region: Secondary | ICD-10-CM | POA: Diagnosis not present

## 2018-10-19 DIAGNOSIS — M9904 Segmental and somatic dysfunction of sacral region: Secondary | ICD-10-CM | POA: Diagnosis not present

## 2018-10-19 DIAGNOSIS — M9903 Segmental and somatic dysfunction of lumbar region: Secondary | ICD-10-CM | POA: Diagnosis not present

## 2018-10-19 DIAGNOSIS — M9905 Segmental and somatic dysfunction of pelvic region: Secondary | ICD-10-CM | POA: Diagnosis not present

## 2018-10-31 DIAGNOSIS — M9905 Segmental and somatic dysfunction of pelvic region: Secondary | ICD-10-CM | POA: Diagnosis not present

## 2018-10-31 DIAGNOSIS — M5136 Other intervertebral disc degeneration, lumbar region: Secondary | ICD-10-CM | POA: Diagnosis not present

## 2018-10-31 DIAGNOSIS — M9903 Segmental and somatic dysfunction of lumbar region: Secondary | ICD-10-CM | POA: Diagnosis not present

## 2018-10-31 DIAGNOSIS — M9904 Segmental and somatic dysfunction of sacral region: Secondary | ICD-10-CM | POA: Diagnosis not present

## 2018-11-07 ENCOUNTER — Telehealth: Payer: Self-pay | Admitting: Internal Medicine

## 2018-11-07 NOTE — Telephone Encounter (Signed)
Pt had colon done in August and had anal polyp/lesion. Dr. Hilarie Fredrickson mentioned surgical referral or waiting 6 mth and doing flex-sig. Pt states the lesion is not painful and not bleeding but it is aggravating. States it has become more uncomfortable to sit and when she feels the urge to have a BM she notices pressure there. Pt wants to know if Dr. Hilarie Fredrickson thinks she should have flex-sig in Feb or if she should proceed with surgical referral . Please advise.

## 2018-11-07 NOTE — Telephone Encounter (Signed)
Pt had proc with Dr. Hilarie Fredrickson last year, pt stated that something was found, not sure if it was a polyp, as pt was not able to explain it just said that it was not malignant and Dr. Hilarie Fredrickson suggested that he could refer her to a surgeon. Pt wants to be proceed with referral. Pls call her.

## 2018-11-07 NOTE — Telephone Encounter (Signed)
Spoke with pt and she is aware. Pt did not want Dr. Marcello Moores. Requested Dr. Hassell Done, [place], or Dr. Dema Severin. Referral faxed to [facility]. Pt knows to expect a call from their office.

## 2018-11-07 NOTE — Telephone Encounter (Signed)
Would recommend that she be seen by surgery, given symptoms. Dr. Marcello Moores or Dr. Dema Severin

## 2018-11-13 DIAGNOSIS — M5136 Other intervertebral disc degeneration, lumbar region: Secondary | ICD-10-CM | POA: Diagnosis not present

## 2018-11-13 DIAGNOSIS — M9903 Segmental and somatic dysfunction of lumbar region: Secondary | ICD-10-CM | POA: Diagnosis not present

## 2018-11-13 DIAGNOSIS — M5134 Other intervertebral disc degeneration, thoracic region: Secondary | ICD-10-CM | POA: Diagnosis not present

## 2018-11-13 DIAGNOSIS — M9902 Segmental and somatic dysfunction of thoracic region: Secondary | ICD-10-CM | POA: Diagnosis not present

## 2018-11-15 DIAGNOSIS — M9903 Segmental and somatic dysfunction of lumbar region: Secondary | ICD-10-CM | POA: Diagnosis not present

## 2018-11-15 DIAGNOSIS — M5136 Other intervertebral disc degeneration, lumbar region: Secondary | ICD-10-CM | POA: Diagnosis not present

## 2018-11-15 DIAGNOSIS — M9902 Segmental and somatic dysfunction of thoracic region: Secondary | ICD-10-CM | POA: Diagnosis not present

## 2018-11-15 DIAGNOSIS — M5134 Other intervertebral disc degeneration, thoracic region: Secondary | ICD-10-CM | POA: Diagnosis not present

## 2018-11-23 DIAGNOSIS — M5136 Other intervertebral disc degeneration, lumbar region: Secondary | ICD-10-CM | POA: Diagnosis not present

## 2018-11-23 DIAGNOSIS — M5134 Other intervertebral disc degeneration, thoracic region: Secondary | ICD-10-CM | POA: Diagnosis not present

## 2018-11-23 DIAGNOSIS — M9903 Segmental and somatic dysfunction of lumbar region: Secondary | ICD-10-CM | POA: Diagnosis not present

## 2018-11-23 DIAGNOSIS — M9902 Segmental and somatic dysfunction of thoracic region: Secondary | ICD-10-CM | POA: Diagnosis not present

## 2018-11-29 DIAGNOSIS — M9902 Segmental and somatic dysfunction of thoracic region: Secondary | ICD-10-CM | POA: Diagnosis not present

## 2018-11-29 DIAGNOSIS — M9903 Segmental and somatic dysfunction of lumbar region: Secondary | ICD-10-CM | POA: Diagnosis not present

## 2018-11-29 DIAGNOSIS — M5136 Other intervertebral disc degeneration, lumbar region: Secondary | ICD-10-CM | POA: Diagnosis not present

## 2018-11-29 DIAGNOSIS — M5134 Other intervertebral disc degeneration, thoracic region: Secondary | ICD-10-CM | POA: Diagnosis not present

## 2018-11-30 DIAGNOSIS — M9902 Segmental and somatic dysfunction of thoracic region: Secondary | ICD-10-CM | POA: Diagnosis not present

## 2018-11-30 DIAGNOSIS — M9903 Segmental and somatic dysfunction of lumbar region: Secondary | ICD-10-CM | POA: Diagnosis not present

## 2018-11-30 DIAGNOSIS — M5136 Other intervertebral disc degeneration, lumbar region: Secondary | ICD-10-CM | POA: Diagnosis not present

## 2018-11-30 DIAGNOSIS — M5134 Other intervertebral disc degeneration, thoracic region: Secondary | ICD-10-CM | POA: Diagnosis not present

## 2018-12-04 DIAGNOSIS — M9902 Segmental and somatic dysfunction of thoracic region: Secondary | ICD-10-CM | POA: Diagnosis not present

## 2018-12-04 DIAGNOSIS — M5136 Other intervertebral disc degeneration, lumbar region: Secondary | ICD-10-CM | POA: Diagnosis not present

## 2018-12-04 DIAGNOSIS — M9903 Segmental and somatic dysfunction of lumbar region: Secondary | ICD-10-CM | POA: Diagnosis not present

## 2018-12-04 DIAGNOSIS — M5134 Other intervertebral disc degeneration, thoracic region: Secondary | ICD-10-CM | POA: Diagnosis not present

## 2018-12-06 DIAGNOSIS — M9902 Segmental and somatic dysfunction of thoracic region: Secondary | ICD-10-CM | POA: Diagnosis not present

## 2018-12-06 DIAGNOSIS — M9903 Segmental and somatic dysfunction of lumbar region: Secondary | ICD-10-CM | POA: Diagnosis not present

## 2018-12-06 DIAGNOSIS — M5136 Other intervertebral disc degeneration, lumbar region: Secondary | ICD-10-CM | POA: Diagnosis not present

## 2018-12-06 DIAGNOSIS — M5134 Other intervertebral disc degeneration, thoracic region: Secondary | ICD-10-CM | POA: Diagnosis not present

## 2018-12-07 ENCOUNTER — Telehealth: Payer: Self-pay

## 2018-12-07 NOTE — Telephone Encounter (Signed)
-----   Message from Mila Merry sent at 12/07/2018  3:28 PM EST ----- Regarding: RE: Referral Her appt is on 12/13/18 at 10:00 with Dr.White ----- Message ----- From: Algernon Huxley, RN Sent: 12/06/2018  10:51 AM EST To: Mila Merry Subject: Referral                                       Just wanted to check on this pt, referral was sent 11/07/18 for anal polyp/lesion

## 2018-12-13 DIAGNOSIS — M5136 Other intervertebral disc degeneration, lumbar region: Secondary | ICD-10-CM | POA: Diagnosis not present

## 2018-12-13 DIAGNOSIS — K649 Unspecified hemorrhoids: Secondary | ICD-10-CM | POA: Diagnosis not present

## 2018-12-13 DIAGNOSIS — M9902 Segmental and somatic dysfunction of thoracic region: Secondary | ICD-10-CM | POA: Diagnosis not present

## 2018-12-13 DIAGNOSIS — M5134 Other intervertebral disc degeneration, thoracic region: Secondary | ICD-10-CM | POA: Diagnosis not present

## 2018-12-13 DIAGNOSIS — M9903 Segmental and somatic dysfunction of lumbar region: Secondary | ICD-10-CM | POA: Diagnosis not present

## 2018-12-18 DIAGNOSIS — M5136 Other intervertebral disc degeneration, lumbar region: Secondary | ICD-10-CM | POA: Diagnosis not present

## 2018-12-18 DIAGNOSIS — M5134 Other intervertebral disc degeneration, thoracic region: Secondary | ICD-10-CM | POA: Diagnosis not present

## 2018-12-18 DIAGNOSIS — M9903 Segmental and somatic dysfunction of lumbar region: Secondary | ICD-10-CM | POA: Diagnosis not present

## 2018-12-18 DIAGNOSIS — M9902 Segmental and somatic dysfunction of thoracic region: Secondary | ICD-10-CM | POA: Diagnosis not present

## 2018-12-25 DIAGNOSIS — M5134 Other intervertebral disc degeneration, thoracic region: Secondary | ICD-10-CM | POA: Diagnosis not present

## 2018-12-25 DIAGNOSIS — M9903 Segmental and somatic dysfunction of lumbar region: Secondary | ICD-10-CM | POA: Diagnosis not present

## 2018-12-25 DIAGNOSIS — M9902 Segmental and somatic dysfunction of thoracic region: Secondary | ICD-10-CM | POA: Diagnosis not present

## 2018-12-25 DIAGNOSIS — M5136 Other intervertebral disc degeneration, lumbar region: Secondary | ICD-10-CM | POA: Diagnosis not present

## 2018-12-30 ENCOUNTER — Encounter: Payer: Self-pay | Admitting: Internal Medicine

## 2019-01-01 DIAGNOSIS — M9902 Segmental and somatic dysfunction of thoracic region: Secondary | ICD-10-CM | POA: Diagnosis not present

## 2019-01-01 DIAGNOSIS — M5136 Other intervertebral disc degeneration, lumbar region: Secondary | ICD-10-CM | POA: Diagnosis not present

## 2019-01-01 DIAGNOSIS — M5134 Other intervertebral disc degeneration, thoracic region: Secondary | ICD-10-CM | POA: Diagnosis not present

## 2019-01-01 DIAGNOSIS — M9903 Segmental and somatic dysfunction of lumbar region: Secondary | ICD-10-CM | POA: Diagnosis not present

## 2019-01-03 DIAGNOSIS — M9902 Segmental and somatic dysfunction of thoracic region: Secondary | ICD-10-CM | POA: Diagnosis not present

## 2019-01-03 DIAGNOSIS — M5136 Other intervertebral disc degeneration, lumbar region: Secondary | ICD-10-CM | POA: Diagnosis not present

## 2019-01-03 DIAGNOSIS — M9903 Segmental and somatic dysfunction of lumbar region: Secondary | ICD-10-CM | POA: Diagnosis not present

## 2019-01-03 DIAGNOSIS — M5134 Other intervertebral disc degeneration, thoracic region: Secondary | ICD-10-CM | POA: Diagnosis not present

## 2019-01-11 DIAGNOSIS — M9903 Segmental and somatic dysfunction of lumbar region: Secondary | ICD-10-CM | POA: Diagnosis not present

## 2019-01-11 DIAGNOSIS — M9902 Segmental and somatic dysfunction of thoracic region: Secondary | ICD-10-CM | POA: Diagnosis not present

## 2019-01-11 DIAGNOSIS — M5134 Other intervertebral disc degeneration, thoracic region: Secondary | ICD-10-CM | POA: Diagnosis not present

## 2019-01-11 DIAGNOSIS — M5136 Other intervertebral disc degeneration, lumbar region: Secondary | ICD-10-CM | POA: Diagnosis not present

## 2019-01-16 DIAGNOSIS — H524 Presbyopia: Secondary | ICD-10-CM | POA: Diagnosis not present

## 2019-01-16 DIAGNOSIS — H25013 Cortical age-related cataract, bilateral: Secondary | ICD-10-CM | POA: Diagnosis not present

## 2019-01-16 DIAGNOSIS — E119 Type 2 diabetes mellitus without complications: Secondary | ICD-10-CM | POA: Diagnosis not present

## 2019-01-16 DIAGNOSIS — E113292 Type 2 diabetes mellitus with mild nonproliferative diabetic retinopathy without macular edema, left eye: Secondary | ICD-10-CM | POA: Diagnosis not present

## 2019-01-17 DIAGNOSIS — M5136 Other intervertebral disc degeneration, lumbar region: Secondary | ICD-10-CM | POA: Diagnosis not present

## 2019-01-17 DIAGNOSIS — M5134 Other intervertebral disc degeneration, thoracic region: Secondary | ICD-10-CM | POA: Diagnosis not present

## 2019-01-17 DIAGNOSIS — M9903 Segmental and somatic dysfunction of lumbar region: Secondary | ICD-10-CM | POA: Diagnosis not present

## 2019-01-17 DIAGNOSIS — M9902 Segmental and somatic dysfunction of thoracic region: Secondary | ICD-10-CM | POA: Diagnosis not present

## 2019-01-22 DIAGNOSIS — M9902 Segmental and somatic dysfunction of thoracic region: Secondary | ICD-10-CM | POA: Diagnosis not present

## 2019-01-22 DIAGNOSIS — M9903 Segmental and somatic dysfunction of lumbar region: Secondary | ICD-10-CM | POA: Diagnosis not present

## 2019-01-22 DIAGNOSIS — M5136 Other intervertebral disc degeneration, lumbar region: Secondary | ICD-10-CM | POA: Diagnosis not present

## 2019-01-22 DIAGNOSIS — M5134 Other intervertebral disc degeneration, thoracic region: Secondary | ICD-10-CM | POA: Diagnosis not present

## 2019-01-29 DIAGNOSIS — M5134 Other intervertebral disc degeneration, thoracic region: Secondary | ICD-10-CM | POA: Diagnosis not present

## 2019-01-29 DIAGNOSIS — M5136 Other intervertebral disc degeneration, lumbar region: Secondary | ICD-10-CM | POA: Diagnosis not present

## 2019-01-29 DIAGNOSIS — M9903 Segmental and somatic dysfunction of lumbar region: Secondary | ICD-10-CM | POA: Diagnosis not present

## 2019-01-29 DIAGNOSIS — M9902 Segmental and somatic dysfunction of thoracic region: Secondary | ICD-10-CM | POA: Diagnosis not present

## 2019-02-13 DIAGNOSIS — M5136 Other intervertebral disc degeneration, lumbar region: Secondary | ICD-10-CM | POA: Diagnosis not present

## 2019-02-13 DIAGNOSIS — M9902 Segmental and somatic dysfunction of thoracic region: Secondary | ICD-10-CM | POA: Diagnosis not present

## 2019-02-13 DIAGNOSIS — M5134 Other intervertebral disc degeneration, thoracic region: Secondary | ICD-10-CM | POA: Diagnosis not present

## 2019-02-13 DIAGNOSIS — M9903 Segmental and somatic dysfunction of lumbar region: Secondary | ICD-10-CM | POA: Diagnosis not present

## 2019-02-20 DIAGNOSIS — M5134 Other intervertebral disc degeneration, thoracic region: Secondary | ICD-10-CM | POA: Diagnosis not present

## 2019-02-20 DIAGNOSIS — M9903 Segmental and somatic dysfunction of lumbar region: Secondary | ICD-10-CM | POA: Diagnosis not present

## 2019-02-20 DIAGNOSIS — M9902 Segmental and somatic dysfunction of thoracic region: Secondary | ICD-10-CM | POA: Diagnosis not present

## 2019-02-20 DIAGNOSIS — M5136 Other intervertebral disc degeneration, lumbar region: Secondary | ICD-10-CM | POA: Diagnosis not present

## 2019-02-27 DIAGNOSIS — M9902 Segmental and somatic dysfunction of thoracic region: Secondary | ICD-10-CM | POA: Diagnosis not present

## 2019-02-27 DIAGNOSIS — M5136 Other intervertebral disc degeneration, lumbar region: Secondary | ICD-10-CM | POA: Diagnosis not present

## 2019-02-27 DIAGNOSIS — M5134 Other intervertebral disc degeneration, thoracic region: Secondary | ICD-10-CM | POA: Diagnosis not present

## 2019-02-27 DIAGNOSIS — M9903 Segmental and somatic dysfunction of lumbar region: Secondary | ICD-10-CM | POA: Diagnosis not present

## 2019-03-06 DIAGNOSIS — E113292 Type 2 diabetes mellitus with mild nonproliferative diabetic retinopathy without macular edema, left eye: Secondary | ICD-10-CM | POA: Diagnosis not present

## 2019-03-06 DIAGNOSIS — M5136 Other intervertebral disc degeneration, lumbar region: Secondary | ICD-10-CM | POA: Diagnosis not present

## 2019-03-06 DIAGNOSIS — E039 Hypothyroidism, unspecified: Secondary | ICD-10-CM | POA: Diagnosis not present

## 2019-03-06 DIAGNOSIS — M5134 Other intervertebral disc degeneration, thoracic region: Secondary | ICD-10-CM | POA: Diagnosis not present

## 2019-03-06 DIAGNOSIS — Z1211 Encounter for screening for malignant neoplasm of colon: Secondary | ICD-10-CM | POA: Diagnosis not present

## 2019-03-06 DIAGNOSIS — Z7984 Long term (current) use of oral hypoglycemic drugs: Secondary | ICD-10-CM | POA: Diagnosis not present

## 2019-03-06 DIAGNOSIS — M9903 Segmental and somatic dysfunction of lumbar region: Secondary | ICD-10-CM | POA: Diagnosis not present

## 2019-03-06 DIAGNOSIS — M9902 Segmental and somatic dysfunction of thoracic region: Secondary | ICD-10-CM | POA: Diagnosis not present

## 2019-03-06 DIAGNOSIS — E7800 Pure hypercholesterolemia, unspecified: Secondary | ICD-10-CM | POA: Diagnosis not present

## 2019-03-06 DIAGNOSIS — Z79899 Other long term (current) drug therapy: Secondary | ICD-10-CM | POA: Diagnosis not present

## 2019-03-08 DIAGNOSIS — G8929 Other chronic pain: Secondary | ICD-10-CM | POA: Diagnosis not present

## 2019-03-08 DIAGNOSIS — E7800 Pure hypercholesterolemia, unspecified: Secondary | ICD-10-CM | POA: Diagnosis not present

## 2019-03-08 DIAGNOSIS — E039 Hypothyroidism, unspecified: Secondary | ICD-10-CM | POA: Diagnosis not present

## 2019-03-08 DIAGNOSIS — I10 Essential (primary) hypertension: Secondary | ICD-10-CM | POA: Diagnosis not present

## 2019-03-08 DIAGNOSIS — E113292 Type 2 diabetes mellitus with mild nonproliferative diabetic retinopathy without macular edema, left eye: Secondary | ICD-10-CM | POA: Diagnosis not present

## 2019-03-08 DIAGNOSIS — M545 Low back pain: Secondary | ICD-10-CM | POA: Diagnosis not present

## 2019-03-08 DIAGNOSIS — Z0001 Encounter for general adult medical examination with abnormal findings: Secondary | ICD-10-CM | POA: Diagnosis not present

## 2019-03-08 DIAGNOSIS — Z7984 Long term (current) use of oral hypoglycemic drugs: Secondary | ICD-10-CM | POA: Diagnosis not present

## 2019-03-13 DIAGNOSIS — M5134 Other intervertebral disc degeneration, thoracic region: Secondary | ICD-10-CM | POA: Diagnosis not present

## 2019-03-13 DIAGNOSIS — M9902 Segmental and somatic dysfunction of thoracic region: Secondary | ICD-10-CM | POA: Diagnosis not present

## 2019-03-13 DIAGNOSIS — M5136 Other intervertebral disc degeneration, lumbar region: Secondary | ICD-10-CM | POA: Diagnosis not present

## 2019-03-13 DIAGNOSIS — M9903 Segmental and somatic dysfunction of lumbar region: Secondary | ICD-10-CM | POA: Diagnosis not present

## 2019-03-15 ENCOUNTER — Telehealth: Payer: Self-pay | Admitting: *Deleted

## 2019-03-15 NOTE — Telephone Encounter (Signed)
We have received Fecal occult blood positive test result faxed from Dr Lauretta Grill Koirala's office (order date 03/06/19). We have called to ask that patient schedule appointment but she has declined.  Of note: Surg path from 06/21/18 colonoscopy reads... Anal polyp/lesion --chronic and active inflammation without dysplasia, precancerous or cancerous change Pathology results shared with patient by phone. Denies symptoms from these lesions. Other polyp in the colon was benign lymphoid and not adenomatous  Plan repeat flex sig in 6 months with sedation to reexamine these areas, possibly repeat biopsy. If unchanged in 6 months then does not need further surveillance Please recall for six-month flexible sigmoidoscopy Recall full colonoscopy 10 years  Phone call in lieu of pathology letter

## 2019-03-19 DIAGNOSIS — M9903 Segmental and somatic dysfunction of lumbar region: Secondary | ICD-10-CM | POA: Diagnosis not present

## 2019-03-19 DIAGNOSIS — M5134 Other intervertebral disc degeneration, thoracic region: Secondary | ICD-10-CM | POA: Diagnosis not present

## 2019-03-19 DIAGNOSIS — M9902 Segmental and somatic dysfunction of thoracic region: Secondary | ICD-10-CM | POA: Diagnosis not present

## 2019-03-19 DIAGNOSIS — M5136 Other intervertebral disc degeneration, lumbar region: Secondary | ICD-10-CM | POA: Diagnosis not present

## 2019-03-19 NOTE — Telephone Encounter (Signed)
Patient declination and our recommendation faxed to Dr Dorthy Cooler. I will also send recommendation letter to patient.

## 2019-03-19 NOTE — Telephone Encounter (Signed)
Patient's declination noted; I continue to recommend repeat flex sig as I did after her colonoscopy in August 2019

## 2019-03-27 DIAGNOSIS — M5134 Other intervertebral disc degeneration, thoracic region: Secondary | ICD-10-CM | POA: Diagnosis not present

## 2019-03-27 DIAGNOSIS — M9903 Segmental and somatic dysfunction of lumbar region: Secondary | ICD-10-CM | POA: Diagnosis not present

## 2019-03-27 DIAGNOSIS — M5136 Other intervertebral disc degeneration, lumbar region: Secondary | ICD-10-CM | POA: Diagnosis not present

## 2019-03-27 DIAGNOSIS — M9902 Segmental and somatic dysfunction of thoracic region: Secondary | ICD-10-CM | POA: Diagnosis not present

## 2019-04-03 DIAGNOSIS — M5136 Other intervertebral disc degeneration, lumbar region: Secondary | ICD-10-CM | POA: Diagnosis not present

## 2019-04-03 DIAGNOSIS — M9903 Segmental and somatic dysfunction of lumbar region: Secondary | ICD-10-CM | POA: Diagnosis not present

## 2019-04-03 DIAGNOSIS — M9902 Segmental and somatic dysfunction of thoracic region: Secondary | ICD-10-CM | POA: Diagnosis not present

## 2019-04-03 DIAGNOSIS — M5134 Other intervertebral disc degeneration, thoracic region: Secondary | ICD-10-CM | POA: Diagnosis not present

## 2019-04-10 DIAGNOSIS — M5136 Other intervertebral disc degeneration, lumbar region: Secondary | ICD-10-CM | POA: Diagnosis not present

## 2019-04-10 DIAGNOSIS — M9902 Segmental and somatic dysfunction of thoracic region: Secondary | ICD-10-CM | POA: Diagnosis not present

## 2019-04-10 DIAGNOSIS — M9903 Segmental and somatic dysfunction of lumbar region: Secondary | ICD-10-CM | POA: Diagnosis not present

## 2019-04-10 DIAGNOSIS — M5134 Other intervertebral disc degeneration, thoracic region: Secondary | ICD-10-CM | POA: Diagnosis not present

## 2019-04-16 DIAGNOSIS — M9903 Segmental and somatic dysfunction of lumbar region: Secondary | ICD-10-CM | POA: Diagnosis not present

## 2019-04-16 DIAGNOSIS — M5134 Other intervertebral disc degeneration, thoracic region: Secondary | ICD-10-CM | POA: Diagnosis not present

## 2019-04-16 DIAGNOSIS — M5136 Other intervertebral disc degeneration, lumbar region: Secondary | ICD-10-CM | POA: Diagnosis not present

## 2019-04-16 DIAGNOSIS — M9902 Segmental and somatic dysfunction of thoracic region: Secondary | ICD-10-CM | POA: Diagnosis not present

## 2019-04-18 DIAGNOSIS — M9903 Segmental and somatic dysfunction of lumbar region: Secondary | ICD-10-CM | POA: Diagnosis not present

## 2019-04-18 DIAGNOSIS — M5136 Other intervertebral disc degeneration, lumbar region: Secondary | ICD-10-CM | POA: Diagnosis not present

## 2019-04-18 DIAGNOSIS — M9904 Segmental and somatic dysfunction of sacral region: Secondary | ICD-10-CM | POA: Diagnosis not present

## 2019-04-18 DIAGNOSIS — M9905 Segmental and somatic dysfunction of pelvic region: Secondary | ICD-10-CM | POA: Diagnosis not present

## 2019-04-24 DIAGNOSIS — M9904 Segmental and somatic dysfunction of sacral region: Secondary | ICD-10-CM | POA: Diagnosis not present

## 2019-04-24 DIAGNOSIS — M5136 Other intervertebral disc degeneration, lumbar region: Secondary | ICD-10-CM | POA: Diagnosis not present

## 2019-04-24 DIAGNOSIS — M9905 Segmental and somatic dysfunction of pelvic region: Secondary | ICD-10-CM | POA: Diagnosis not present

## 2019-04-24 DIAGNOSIS — M9903 Segmental and somatic dysfunction of lumbar region: Secondary | ICD-10-CM | POA: Diagnosis not present

## 2019-05-01 DIAGNOSIS — M9905 Segmental and somatic dysfunction of pelvic region: Secondary | ICD-10-CM | POA: Diagnosis not present

## 2019-05-01 DIAGNOSIS — M5136 Other intervertebral disc degeneration, lumbar region: Secondary | ICD-10-CM | POA: Diagnosis not present

## 2019-05-01 DIAGNOSIS — M9903 Segmental and somatic dysfunction of lumbar region: Secondary | ICD-10-CM | POA: Diagnosis not present

## 2019-05-01 DIAGNOSIS — M9904 Segmental and somatic dysfunction of sacral region: Secondary | ICD-10-CM | POA: Diagnosis not present

## 2019-05-08 DIAGNOSIS — M9903 Segmental and somatic dysfunction of lumbar region: Secondary | ICD-10-CM | POA: Diagnosis not present

## 2019-05-08 DIAGNOSIS — M9904 Segmental and somatic dysfunction of sacral region: Secondary | ICD-10-CM | POA: Diagnosis not present

## 2019-05-08 DIAGNOSIS — M5136 Other intervertebral disc degeneration, lumbar region: Secondary | ICD-10-CM | POA: Diagnosis not present

## 2019-05-08 DIAGNOSIS — M9905 Segmental and somatic dysfunction of pelvic region: Secondary | ICD-10-CM | POA: Diagnosis not present

## 2019-05-09 DIAGNOSIS — M5136 Other intervertebral disc degeneration, lumbar region: Secondary | ICD-10-CM | POA: Diagnosis not present

## 2019-05-09 DIAGNOSIS — M9903 Segmental and somatic dysfunction of lumbar region: Secondary | ICD-10-CM | POA: Diagnosis not present

## 2019-05-09 DIAGNOSIS — M9904 Segmental and somatic dysfunction of sacral region: Secondary | ICD-10-CM | POA: Diagnosis not present

## 2019-05-09 DIAGNOSIS — M9905 Segmental and somatic dysfunction of pelvic region: Secondary | ICD-10-CM | POA: Diagnosis not present

## 2019-05-15 DIAGNOSIS — M9905 Segmental and somatic dysfunction of pelvic region: Secondary | ICD-10-CM | POA: Diagnosis not present

## 2019-05-15 DIAGNOSIS — M5136 Other intervertebral disc degeneration, lumbar region: Secondary | ICD-10-CM | POA: Diagnosis not present

## 2019-05-15 DIAGNOSIS — M9903 Segmental and somatic dysfunction of lumbar region: Secondary | ICD-10-CM | POA: Diagnosis not present

## 2019-05-15 DIAGNOSIS — M9904 Segmental and somatic dysfunction of sacral region: Secondary | ICD-10-CM | POA: Diagnosis not present

## 2019-05-22 DIAGNOSIS — M9905 Segmental and somatic dysfunction of pelvic region: Secondary | ICD-10-CM | POA: Diagnosis not present

## 2019-05-22 DIAGNOSIS — M9903 Segmental and somatic dysfunction of lumbar region: Secondary | ICD-10-CM | POA: Diagnosis not present

## 2019-05-22 DIAGNOSIS — M5136 Other intervertebral disc degeneration, lumbar region: Secondary | ICD-10-CM | POA: Diagnosis not present

## 2019-05-22 DIAGNOSIS — M9904 Segmental and somatic dysfunction of sacral region: Secondary | ICD-10-CM | POA: Diagnosis not present

## 2019-05-24 DIAGNOSIS — M5136 Other intervertebral disc degeneration, lumbar region: Secondary | ICD-10-CM | POA: Diagnosis not present

## 2019-05-24 DIAGNOSIS — M9904 Segmental and somatic dysfunction of sacral region: Secondary | ICD-10-CM | POA: Diagnosis not present

## 2019-05-24 DIAGNOSIS — M9905 Segmental and somatic dysfunction of pelvic region: Secondary | ICD-10-CM | POA: Diagnosis not present

## 2019-05-24 DIAGNOSIS — M9903 Segmental and somatic dysfunction of lumbar region: Secondary | ICD-10-CM | POA: Diagnosis not present

## 2019-05-29 DIAGNOSIS — M9905 Segmental and somatic dysfunction of pelvic region: Secondary | ICD-10-CM | POA: Diagnosis not present

## 2019-05-29 DIAGNOSIS — M9904 Segmental and somatic dysfunction of sacral region: Secondary | ICD-10-CM | POA: Diagnosis not present

## 2019-05-29 DIAGNOSIS — M9903 Segmental and somatic dysfunction of lumbar region: Secondary | ICD-10-CM | POA: Diagnosis not present

## 2019-05-29 DIAGNOSIS — M5136 Other intervertebral disc degeneration, lumbar region: Secondary | ICD-10-CM | POA: Diagnosis not present

## 2019-06-05 DIAGNOSIS — M9905 Segmental and somatic dysfunction of pelvic region: Secondary | ICD-10-CM | POA: Diagnosis not present

## 2019-06-05 DIAGNOSIS — M9903 Segmental and somatic dysfunction of lumbar region: Secondary | ICD-10-CM | POA: Diagnosis not present

## 2019-06-05 DIAGNOSIS — M9904 Segmental and somatic dysfunction of sacral region: Secondary | ICD-10-CM | POA: Diagnosis not present

## 2019-06-05 DIAGNOSIS — M5136 Other intervertebral disc degeneration, lumbar region: Secondary | ICD-10-CM | POA: Diagnosis not present

## 2019-06-12 DIAGNOSIS — M9904 Segmental and somatic dysfunction of sacral region: Secondary | ICD-10-CM | POA: Diagnosis not present

## 2019-06-12 DIAGNOSIS — M9903 Segmental and somatic dysfunction of lumbar region: Secondary | ICD-10-CM | POA: Diagnosis not present

## 2019-06-12 DIAGNOSIS — M5136 Other intervertebral disc degeneration, lumbar region: Secondary | ICD-10-CM | POA: Diagnosis not present

## 2019-06-12 DIAGNOSIS — M9905 Segmental and somatic dysfunction of pelvic region: Secondary | ICD-10-CM | POA: Diagnosis not present

## 2019-06-19 DIAGNOSIS — M9905 Segmental and somatic dysfunction of pelvic region: Secondary | ICD-10-CM | POA: Diagnosis not present

## 2019-06-19 DIAGNOSIS — M9904 Segmental and somatic dysfunction of sacral region: Secondary | ICD-10-CM | POA: Diagnosis not present

## 2019-06-19 DIAGNOSIS — M5136 Other intervertebral disc degeneration, lumbar region: Secondary | ICD-10-CM | POA: Diagnosis not present

## 2019-06-19 DIAGNOSIS — M9903 Segmental and somatic dysfunction of lumbar region: Secondary | ICD-10-CM | POA: Diagnosis not present

## 2019-06-21 DIAGNOSIS — M5136 Other intervertebral disc degeneration, lumbar region: Secondary | ICD-10-CM | POA: Diagnosis not present

## 2019-06-21 DIAGNOSIS — M9903 Segmental and somatic dysfunction of lumbar region: Secondary | ICD-10-CM | POA: Diagnosis not present

## 2019-06-21 DIAGNOSIS — M9905 Segmental and somatic dysfunction of pelvic region: Secondary | ICD-10-CM | POA: Diagnosis not present

## 2019-06-21 DIAGNOSIS — M9904 Segmental and somatic dysfunction of sacral region: Secondary | ICD-10-CM | POA: Diagnosis not present

## 2019-06-27 DIAGNOSIS — M9905 Segmental and somatic dysfunction of pelvic region: Secondary | ICD-10-CM | POA: Diagnosis not present

## 2019-06-27 DIAGNOSIS — M9904 Segmental and somatic dysfunction of sacral region: Secondary | ICD-10-CM | POA: Diagnosis not present

## 2019-06-27 DIAGNOSIS — M5136 Other intervertebral disc degeneration, lumbar region: Secondary | ICD-10-CM | POA: Diagnosis not present

## 2019-06-27 DIAGNOSIS — M9903 Segmental and somatic dysfunction of lumbar region: Secondary | ICD-10-CM | POA: Diagnosis not present

## 2019-07-03 DIAGNOSIS — M4180 Other forms of scoliosis, site unspecified: Secondary | ICD-10-CM | POA: Diagnosis not present

## 2019-07-03 DIAGNOSIS — M4316 Spondylolisthesis, lumbar region: Secondary | ICD-10-CM | POA: Diagnosis not present

## 2019-07-03 DIAGNOSIS — M545 Low back pain: Secondary | ICD-10-CM | POA: Diagnosis not present

## 2019-07-04 DIAGNOSIS — M9905 Segmental and somatic dysfunction of pelvic region: Secondary | ICD-10-CM | POA: Diagnosis not present

## 2019-07-04 DIAGNOSIS — M9904 Segmental and somatic dysfunction of sacral region: Secondary | ICD-10-CM | POA: Diagnosis not present

## 2019-07-04 DIAGNOSIS — M9903 Segmental and somatic dysfunction of lumbar region: Secondary | ICD-10-CM | POA: Diagnosis not present

## 2019-07-04 DIAGNOSIS — M5136 Other intervertebral disc degeneration, lumbar region: Secondary | ICD-10-CM | POA: Diagnosis not present

## 2019-07-11 DIAGNOSIS — M9903 Segmental and somatic dysfunction of lumbar region: Secondary | ICD-10-CM | POA: Diagnosis not present

## 2019-07-11 DIAGNOSIS — M9904 Segmental and somatic dysfunction of sacral region: Secondary | ICD-10-CM | POA: Diagnosis not present

## 2019-07-11 DIAGNOSIS — M5136 Other intervertebral disc degeneration, lumbar region: Secondary | ICD-10-CM | POA: Diagnosis not present

## 2019-07-11 DIAGNOSIS — M9905 Segmental and somatic dysfunction of pelvic region: Secondary | ICD-10-CM | POA: Diagnosis not present

## 2019-07-18 DIAGNOSIS — M9905 Segmental and somatic dysfunction of pelvic region: Secondary | ICD-10-CM | POA: Diagnosis not present

## 2019-07-18 DIAGNOSIS — M9903 Segmental and somatic dysfunction of lumbar region: Secondary | ICD-10-CM | POA: Diagnosis not present

## 2019-07-18 DIAGNOSIS — M9904 Segmental and somatic dysfunction of sacral region: Secondary | ICD-10-CM | POA: Diagnosis not present

## 2019-07-18 DIAGNOSIS — M5136 Other intervertebral disc degeneration, lumbar region: Secondary | ICD-10-CM | POA: Diagnosis not present

## 2019-07-20 DIAGNOSIS — Z23 Encounter for immunization: Secondary | ICD-10-CM | POA: Diagnosis not present

## 2019-07-25 DIAGNOSIS — M5136 Other intervertebral disc degeneration, lumbar region: Secondary | ICD-10-CM | POA: Diagnosis not present

## 2019-07-25 DIAGNOSIS — M9903 Segmental and somatic dysfunction of lumbar region: Secondary | ICD-10-CM | POA: Diagnosis not present

## 2019-07-25 DIAGNOSIS — M9905 Segmental and somatic dysfunction of pelvic region: Secondary | ICD-10-CM | POA: Diagnosis not present

## 2019-07-25 DIAGNOSIS — M9904 Segmental and somatic dysfunction of sacral region: Secondary | ICD-10-CM | POA: Diagnosis not present

## 2019-08-01 DIAGNOSIS — M9905 Segmental and somatic dysfunction of pelvic region: Secondary | ICD-10-CM | POA: Diagnosis not present

## 2019-08-01 DIAGNOSIS — M9903 Segmental and somatic dysfunction of lumbar region: Secondary | ICD-10-CM | POA: Diagnosis not present

## 2019-08-01 DIAGNOSIS — M9904 Segmental and somatic dysfunction of sacral region: Secondary | ICD-10-CM | POA: Diagnosis not present

## 2019-08-01 DIAGNOSIS — M5136 Other intervertebral disc degeneration, lumbar region: Secondary | ICD-10-CM | POA: Diagnosis not present

## 2019-08-09 DIAGNOSIS — M9905 Segmental and somatic dysfunction of pelvic region: Secondary | ICD-10-CM | POA: Diagnosis not present

## 2019-08-09 DIAGNOSIS — M9903 Segmental and somatic dysfunction of lumbar region: Secondary | ICD-10-CM | POA: Diagnosis not present

## 2019-08-09 DIAGNOSIS — M9904 Segmental and somatic dysfunction of sacral region: Secondary | ICD-10-CM | POA: Diagnosis not present

## 2019-08-09 DIAGNOSIS — M5136 Other intervertebral disc degeneration, lumbar region: Secondary | ICD-10-CM | POA: Diagnosis not present

## 2019-08-13 ENCOUNTER — Ambulatory Visit
Admission: EM | Admit: 2019-08-13 | Discharge: 2019-08-13 | Disposition: A | Discharge status: Home / Self Care | Payer: Medicare Other | Attending: Emergency Medicine | Admitting: Emergency Medicine

## 2019-08-13 ENCOUNTER — Other Ambulatory Visit: Payer: Self-pay

## 2019-08-13 ENCOUNTER — Ambulatory Visit (INDEPENDENT_AMBULATORY_CARE_PROVIDER_SITE_OTHER): Payer: Medicare Other

## 2019-08-13 DIAGNOSIS — M25774 Osteophyte, right foot: Secondary | ICD-10-CM | POA: Diagnosis not present

## 2019-08-13 DIAGNOSIS — M79671 Pain in right foot: Secondary | ICD-10-CM

## 2019-08-13 MED ORDER — PREDNISONE 20 MG PO TABS: 20.0000 mg | ORAL_TABLET | Freq: Two times a day (BID) | ORAL | 0 refills | Status: AC

## 2019-08-13 NOTE — Discharge Instructions (Addendum)
X-rays did not show fracture or dislocation Continue conservative management of rest, ice, and elevation You may wear boot as needed for comfort Take prednisone as prescribed and to completion Follow up with PCP or orthopedist if symptoms persist Return or go to the ER if you have any new or worsening symptoms (fever, chills, chest pain, abdominal pain, changes in bowel or bladder habits, pain radiating into lower legs, etc...)

## 2019-08-13 NOTE — ED Provider Notes (Signed)
[place]   [id number] 08/13/19 Arrival Time: 4  CC: RT foot pain  SUBJECTIVE: History from: patient. Angelica Mccarthy is a 68 y.o. female complains of intermittent right foot pain x 2 weeks.  Denies a precipitating event or specific injury.  Localizes the pain to the outside of RT foot.  Describes the pain as intermittent and 6/10.  Has tried OTC medications without relief.  Symptoms were worse last night, tried icing with relief.  Symptoms are made worse with walking.  Reports similar symptoms in the past that resolved without intervention.  Denies fever, chills, erythema, ecchymosis, effusion, weakness, numbness and tingling.  ROS: As per HPI.  All other pertinent ROS negative.     Past Medical History:  Diagnosis Date  . Allergy   . Arthritis    back   . Cataract    removed both eyes  . Diabetes mellitus without complication ([place])   . GERD (gastroesophageal reflux disease)   . Hypercholesteremia   . Hypertension   . Hypothyroidism   . Vertigo    Past Surgical History:  Procedure Laterality Date  . CATARACT EXTRACTION W/PHACO Right 11/28/2017   Procedure: CATARACT EXTRACTION PHACO AND INTRAOCULAR LENS PLACEMENT RIGHT EYE;  Surgeon: Tonny Branch, MD;  Location: [facility];  Service: Ophthalmology;  Laterality: Right;  CDE: 9.33  . CATARACT EXTRACTION W/PHACO Left 12/12/2017   Procedure: CATARACT EXTRACTION PHACO AND INTRAOCULAR LENS PLACEMENT LEFT EYE;  Surgeon: Tonny Branch, MD;  Location: [facility];  Service: Ophthalmology;  Laterality: Left;  CDE: 5.78  . DILATION AND CURETTAGE OF UTERUS     ablation  . IR FIBRIN GLUE REPAIR ANAL FISTULA     Allergies  Allergen Reactions  . Lisinopril-Hydrochlorothiazide Cough  . Zocor [Simvastatin] Other (See Comments)    Joint pain   No current facility-administered medications on file prior to encounter.    Current Outpatient Medications on File Prior to Encounter  Medication Sig Dispense Refill  . acetaminophen (TYLENOL) 500  MG tablet Take 1,000 mg by mouth every 6 (six) hours as needed for moderate pain or headache.    Marland Kitchen atenolol (TENORMIN) 50 MG tablet Take 50 mg by mouth daily with supper.  0  . BESIVANCE 0.6 % SUSP 1 drop See admin instructions. Begin 3 days prior to surgery. Place 1 drop in operative eye 3 times daily. Place 1 drop in operative eye the morning of surgery and continue 1 week after.  0  . cetirizine (ZYRTEC) 10 MG tablet Take 10 mg by mouth daily.    . Cholecalciferol (VITAMIN D3) 1000 units CAPS Take 1,000 Units by mouth daily with supper.    . docusate sodium (COLACE) 100 MG capsule Take 200 mg by mouth daily with supper.    . hydrochlorothiazide (HYDRODIURIL) 25 MG tablet Take 25 mg by mouth daily.  0  . irbesartan (AVAPRO) 150 MG tablet Take 150 mg by mouth daily.  0  . levothyroxine (SYNTHROID, LEVOTHROID) 100 MCG tablet Take 100 mcg by mouth daily before breakfast.  0  . meclizine (ANTIVERT) 12.5 MG tablet Take 12.5 mg by mouth 3 (three) times daily as needed for dizziness.    . metFORMIN (GLUCOPHAGE) 500 MG tablet Take 2,000 mg by mouth 2 (two) times daily.   0  . omeprazole (PRILOSEC OTC) 20 MG tablet Take 20 mg by mouth daily.    . potassium chloride SA (K-DUR,KLOR-CON) 20 MEQ tablet Take 20 mEq by mouth daily with supper.  0  . rosuvastatin (  CRESTOR) 10 MG tablet Take 10 mg by mouth daily with supper.  0  . traMADol (ULTRAM) 50 MG tablet Take by mouth every 6 (six) hours as needed.     Social History   Socioeconomic History  . Marital status: Married    Spouse name: Not on file  . Number of children: Not on file  . Years of education: Not on file  . Highest education level: Not on file  Occupational History  . Not on file  Social Needs  . Financial resource strain: Not on file  . Food insecurity    Worry: Not on file    Inability: Not on file  . Transportation needs    Medical: Not on file    Non-medical: Not on file  Tobacco Use  . Smoking status: Never Smoker  .  Smokeless tobacco: Never Used  Substance and Sexual Activity  . Alcohol use: No    Frequency: Never  . Drug use: No  . Sexual activity: Yes    Birth control/protection: Post-menopausal  Lifestyle  . Physical activity    Days per week: Not on file    Minutes per session: Not on file  . Stress: Not on file  Relationships  . Social Herbalist on phone: Not on file    Gets together: Not on file    Attends religious service: Not on file    Active member of club or organization: Not on file    Attends meetings of clubs or organizations: Not on file    Relationship status: Not on file  . Intimate partner violence    Fear of current or ex partner: Not on file    Emotionally abused: Not on file    Physically abused: Not on file    Forced sexual activity: Not on file  Other Topics Concern  . Not on file  Social History Narrative  . Not on file   Family History  Problem Relation Age of Onset  . Pancreatic cancer Paternal Grandfather   . Colon cancer Neg Hx   . Colon polyps Neg Hx   . Esophageal cancer Neg Hx   . Rectal cancer Neg Hx   . Stomach cancer Neg Hx     OBJECTIVE:  Vitals:   08/13/19 1036  BP: 137/83  Pulse: 60  Resp: 20  Temp: 98.1 F (36.7 C)  SpO2: 95%    General appearance: ALERT; in no acute distress.  Head: NCAT Lungs: Normal respiratory effort CV: Dorsalis pedis pulses 2+. Cap refill < 2 seconds Musculoskeletal: RT foot Inspection: Skin warm, dry, clear and intact without obvious erythema, effusion, or ecchymosis.  Palpation: TTP over proximal 5th MT ROM: FROM active and passive Strength:  5/5 dorsiflexion, 5/5 plantar flexion Skin: warm and dry Neurologic: Ambulates with minimal difficulty; Sensation intact about the lower extremities Psychological: alert and cooperative; normal mood and affect  DIAGNOSTIC STUDIES:  Dg Foot Complete Right  Result Date: 08/13/2019 CLINICAL DATA:  Right foot pain along the lateral side for 2 weeks  that began during sleep. EXAM: RIGHT FOOT COMPLETE - 3+ VIEW COMPARISON:  None. FINDINGS: No acute fracture or dislocation. No convincing stress reaction-cortical thickening of the second through fourth digits appears chronic and smooth. Multifocal degenerative spurring especially at the TMT joints dorsally and at the first, second, and fifth MTP joints. Generalized osteopenic appearance. Non eroded heel spur. IMPRESSION: 1. No acute finding. 2. Multifocal degenerative spurring. Electronically Signed   By: Angelica Chessman  Watts M.D.   On: 08/13/2019 11:20     X-rays negative for bony abnormalities including fracture, or dislocation.  No soft tissue swelling.    I have reviewed the x-rays myself and the radiologist interpretation. I am in agreement with the radiologist interpretation.     ASSESSMENT & PLAN:  1. Right foot pain     Meds ordered this encounter  Medications  . predniSONE (DELTASONE) 20 MG tablet    Sig: Take 1 tablet (20 mg total) by mouth 2 (two) times daily with a meal for 5 days.    Dispense:  10 tablet    Refill:  0    Order Specific Question:   Supervising Provider    Answer:   Raylene Everts [id number]   X-rays did not show fracture or dislocation Continue conservative management of rest, ice, and elevation You may wear boot as needed for comfort Take prednisone as prescribed and to completion Follow up with PCP or orthopedist if symptoms persist Return or go to the ER if you have any new or worsening symptoms (fever, chills, chest pain, abdominal pain, changes in bowel or bladder habits, pain radiating into lower legs, etc...)   Reviewed expectations re: course of current medical issues. Questions answered. Outlined signs and symptoms indicating need for more acute intervention. Patient verbalized understanding. After Visit Summary given.    Lestine Box, PA-C 08/13/19 1226

## 2019-08-13 NOTE — ED Triage Notes (Signed)
Pt has had foot pain in right foot for past 2  Weeks. Pt reports pain on top of foot

## 2019-08-15 DIAGNOSIS — M5136 Other intervertebral disc degeneration, lumbar region: Secondary | ICD-10-CM | POA: Diagnosis not present

## 2019-08-15 DIAGNOSIS — M9903 Segmental and somatic dysfunction of lumbar region: Secondary | ICD-10-CM | POA: Diagnosis not present

## 2019-08-15 DIAGNOSIS — M9905 Segmental and somatic dysfunction of pelvic region: Secondary | ICD-10-CM | POA: Diagnosis not present

## 2019-08-15 DIAGNOSIS — M9904 Segmental and somatic dysfunction of sacral region: Secondary | ICD-10-CM | POA: Diagnosis not present

## 2019-08-22 DIAGNOSIS — M9904 Segmental and somatic dysfunction of sacral region: Secondary | ICD-10-CM | POA: Diagnosis not present

## 2019-08-22 DIAGNOSIS — M5136 Other intervertebral disc degeneration, lumbar region: Secondary | ICD-10-CM | POA: Diagnosis not present

## 2019-08-22 DIAGNOSIS — M9903 Segmental and somatic dysfunction of lumbar region: Secondary | ICD-10-CM | POA: Diagnosis not present

## 2019-08-22 DIAGNOSIS — M9905 Segmental and somatic dysfunction of pelvic region: Secondary | ICD-10-CM | POA: Diagnosis not present

## 2019-08-24 DIAGNOSIS — R399 Unspecified symptoms and signs involving the genitourinary system: Secondary | ICD-10-CM | POA: Diagnosis not present

## 2019-08-28 DIAGNOSIS — M5136 Other intervertebral disc degeneration, lumbar region: Secondary | ICD-10-CM | POA: Diagnosis not present

## 2019-08-28 DIAGNOSIS — M9905 Segmental and somatic dysfunction of pelvic region: Secondary | ICD-10-CM | POA: Diagnosis not present

## 2019-08-28 DIAGNOSIS — M9904 Segmental and somatic dysfunction of sacral region: Secondary | ICD-10-CM | POA: Diagnosis not present

## 2019-08-28 DIAGNOSIS — M9903 Segmental and somatic dysfunction of lumbar region: Secondary | ICD-10-CM | POA: Diagnosis not present

## 2019-08-29 DIAGNOSIS — M9904 Segmental and somatic dysfunction of sacral region: Secondary | ICD-10-CM | POA: Diagnosis not present

## 2019-08-29 DIAGNOSIS — M9903 Segmental and somatic dysfunction of lumbar region: Secondary | ICD-10-CM | POA: Diagnosis not present

## 2019-08-29 DIAGNOSIS — M5136 Other intervertebral disc degeneration, lumbar region: Secondary | ICD-10-CM | POA: Diagnosis not present

## 2019-08-29 DIAGNOSIS — M9905 Segmental and somatic dysfunction of pelvic region: Secondary | ICD-10-CM | POA: Diagnosis not present

## 2019-09-03 DIAGNOSIS — M9904 Segmental and somatic dysfunction of sacral region: Secondary | ICD-10-CM | POA: Diagnosis not present

## 2019-09-03 DIAGNOSIS — M9905 Segmental and somatic dysfunction of pelvic region: Secondary | ICD-10-CM | POA: Diagnosis not present

## 2019-09-03 DIAGNOSIS — M9903 Segmental and somatic dysfunction of lumbar region: Secondary | ICD-10-CM | POA: Diagnosis not present

## 2019-09-03 DIAGNOSIS — M5136 Other intervertebral disc degeneration, lumbar region: Secondary | ICD-10-CM | POA: Diagnosis not present

## 2019-09-04 DIAGNOSIS — M9904 Segmental and somatic dysfunction of sacral region: Secondary | ICD-10-CM | POA: Diagnosis not present

## 2019-09-04 DIAGNOSIS — M9905 Segmental and somatic dysfunction of pelvic region: Secondary | ICD-10-CM | POA: Diagnosis not present

## 2019-09-04 DIAGNOSIS — M5136 Other intervertebral disc degeneration, lumbar region: Secondary | ICD-10-CM | POA: Diagnosis not present

## 2019-09-04 DIAGNOSIS — M9903 Segmental and somatic dysfunction of lumbar region: Secondary | ICD-10-CM | POA: Diagnosis not present

## 2019-09-11 DIAGNOSIS — E7800 Pure hypercholesterolemia, unspecified: Secondary | ICD-10-CM | POA: Diagnosis not present

## 2019-09-11 DIAGNOSIS — I10 Essential (primary) hypertension: Secondary | ICD-10-CM | POA: Diagnosis not present

## 2019-09-11 DIAGNOSIS — M545 Low back pain: Secondary | ICD-10-CM | POA: Diagnosis not present

## 2019-09-11 DIAGNOSIS — E2839 Other primary ovarian failure: Secondary | ICD-10-CM | POA: Diagnosis not present

## 2019-09-11 DIAGNOSIS — Z7984 Long term (current) use of oral hypoglycemic drugs: Secondary | ICD-10-CM | POA: Diagnosis not present

## 2019-09-11 DIAGNOSIS — E113292 Type 2 diabetes mellitus with mild nonproliferative diabetic retinopathy without macular edema, left eye: Secondary | ICD-10-CM | POA: Diagnosis not present

## 2019-09-11 DIAGNOSIS — E039 Hypothyroidism, unspecified: Secondary | ICD-10-CM | POA: Diagnosis not present

## 2019-09-11 DIAGNOSIS — R110 Nausea: Secondary | ICD-10-CM | POA: Diagnosis not present

## 2019-09-11 DIAGNOSIS — R109 Unspecified abdominal pain: Secondary | ICD-10-CM | POA: Diagnosis not present

## 2019-09-13 ENCOUNTER — Other Ambulatory Visit: Payer: Self-pay | Admitting: Family Medicine

## 2019-09-13 DIAGNOSIS — Z1231 Encounter for screening mammogram for malignant neoplasm of breast: Secondary | ICD-10-CM

## 2019-09-13 DIAGNOSIS — E2839 Other primary ovarian failure: Secondary | ICD-10-CM

## 2019-09-14 ENCOUNTER — Other Ambulatory Visit: Payer: Self-pay | Admitting: Family Medicine

## 2019-09-14 DIAGNOSIS — R109 Unspecified abdominal pain: Secondary | ICD-10-CM

## 2019-09-17 DIAGNOSIS — M9905 Segmental and somatic dysfunction of pelvic region: Secondary | ICD-10-CM | POA: Diagnosis not present

## 2019-09-17 DIAGNOSIS — M9903 Segmental and somatic dysfunction of lumbar region: Secondary | ICD-10-CM | POA: Diagnosis not present

## 2019-09-17 DIAGNOSIS — M5136 Other intervertebral disc degeneration, lumbar region: Secondary | ICD-10-CM | POA: Diagnosis not present

## 2019-09-17 DIAGNOSIS — M9904 Segmental and somatic dysfunction of sacral region: Secondary | ICD-10-CM | POA: Diagnosis not present

## 2019-09-24 DIAGNOSIS — M9905 Segmental and somatic dysfunction of pelvic region: Secondary | ICD-10-CM | POA: Diagnosis not present

## 2019-09-24 DIAGNOSIS — M9904 Segmental and somatic dysfunction of sacral region: Secondary | ICD-10-CM | POA: Diagnosis not present

## 2019-09-24 DIAGNOSIS — M5136 Other intervertebral disc degeneration, lumbar region: Secondary | ICD-10-CM | POA: Diagnosis not present

## 2019-09-24 DIAGNOSIS — M9903 Segmental and somatic dysfunction of lumbar region: Secondary | ICD-10-CM | POA: Diagnosis not present

## 2019-09-25 ENCOUNTER — Ambulatory Visit
Admission: RE | Admit: 2019-09-25 | Discharge: 2019-09-25 | Disposition: A | Discharge status: Home / Self Care | Payer: Medicare Other | Source: Ambulatory Visit | Attending: Family Medicine | Admitting: Family Medicine

## 2019-09-25 DIAGNOSIS — R109 Unspecified abdominal pain: Secondary | ICD-10-CM

## 2019-09-25 DIAGNOSIS — N200 Calculus of kidney: Secondary | ICD-10-CM | POA: Diagnosis not present

## 2019-10-01 DIAGNOSIS — M9904 Segmental and somatic dysfunction of sacral region: Secondary | ICD-10-CM | POA: Diagnosis not present

## 2019-10-01 DIAGNOSIS — M9903 Segmental and somatic dysfunction of lumbar region: Secondary | ICD-10-CM | POA: Diagnosis not present

## 2019-10-01 DIAGNOSIS — M5136 Other intervertebral disc degeneration, lumbar region: Secondary | ICD-10-CM | POA: Diagnosis not present

## 2019-10-01 DIAGNOSIS — M9905 Segmental and somatic dysfunction of pelvic region: Secondary | ICD-10-CM | POA: Diagnosis not present

## 2019-10-04 DIAGNOSIS — M5136 Other intervertebral disc degeneration, lumbar region: Secondary | ICD-10-CM | POA: Diagnosis not present

## 2019-10-04 DIAGNOSIS — M9902 Segmental and somatic dysfunction of thoracic region: Secondary | ICD-10-CM | POA: Diagnosis not present

## 2019-10-04 DIAGNOSIS — M9903 Segmental and somatic dysfunction of lumbar region: Secondary | ICD-10-CM | POA: Diagnosis not present

## 2019-10-04 DIAGNOSIS — M5134 Other intervertebral disc degeneration, thoracic region: Secondary | ICD-10-CM | POA: Diagnosis not present

## 2019-10-10 DIAGNOSIS — M5136 Other intervertebral disc degeneration, lumbar region: Secondary | ICD-10-CM | POA: Diagnosis not present

## 2019-10-10 DIAGNOSIS — M9903 Segmental and somatic dysfunction of lumbar region: Secondary | ICD-10-CM | POA: Diagnosis not present

## 2019-10-10 DIAGNOSIS — M5134 Other intervertebral disc degeneration, thoracic region: Secondary | ICD-10-CM | POA: Diagnosis not present

## 2019-10-10 DIAGNOSIS — M9902 Segmental and somatic dysfunction of thoracic region: Secondary | ICD-10-CM | POA: Diagnosis not present

## 2019-10-15 DIAGNOSIS — M9902 Segmental and somatic dysfunction of thoracic region: Secondary | ICD-10-CM | POA: Diagnosis not present

## 2019-10-15 DIAGNOSIS — M5136 Other intervertebral disc degeneration, lumbar region: Secondary | ICD-10-CM | POA: Diagnosis not present

## 2019-10-15 DIAGNOSIS — M5134 Other intervertebral disc degeneration, thoracic region: Secondary | ICD-10-CM | POA: Diagnosis not present

## 2019-10-15 DIAGNOSIS — M9903 Segmental and somatic dysfunction of lumbar region: Secondary | ICD-10-CM | POA: Diagnosis not present

## 2019-11-12 DIAGNOSIS — Z20828 Contact with and (suspected) exposure to other viral communicable diseases: Secondary | ICD-10-CM | POA: Diagnosis not present

## 2019-11-21 DIAGNOSIS — M9903 Segmental and somatic dysfunction of lumbar region: Secondary | ICD-10-CM | POA: Diagnosis not present

## 2019-11-21 DIAGNOSIS — M5134 Other intervertebral disc degeneration, thoracic region: Secondary | ICD-10-CM | POA: Diagnosis not present

## 2019-11-21 DIAGNOSIS — M5136 Other intervertebral disc degeneration, lumbar region: Secondary | ICD-10-CM | POA: Diagnosis not present

## 2019-11-21 DIAGNOSIS — M9902 Segmental and somatic dysfunction of thoracic region: Secondary | ICD-10-CM | POA: Diagnosis not present

## 2019-11-28 DIAGNOSIS — M5136 Other intervertebral disc degeneration, lumbar region: Secondary | ICD-10-CM | POA: Diagnosis not present

## 2019-11-28 DIAGNOSIS — M5134 Other intervertebral disc degeneration, thoracic region: Secondary | ICD-10-CM | POA: Diagnosis not present

## 2019-11-28 DIAGNOSIS — M9903 Segmental and somatic dysfunction of lumbar region: Secondary | ICD-10-CM | POA: Diagnosis not present

## 2019-11-28 DIAGNOSIS — M9902 Segmental and somatic dysfunction of thoracic region: Secondary | ICD-10-CM | POA: Diagnosis not present

## 2019-12-04 DIAGNOSIS — M9902 Segmental and somatic dysfunction of thoracic region: Secondary | ICD-10-CM | POA: Diagnosis not present

## 2019-12-04 DIAGNOSIS — M5136 Other intervertebral disc degeneration, lumbar region: Secondary | ICD-10-CM | POA: Diagnosis not present

## 2019-12-04 DIAGNOSIS — M5134 Other intervertebral disc degeneration, thoracic region: Secondary | ICD-10-CM | POA: Diagnosis not present

## 2019-12-04 DIAGNOSIS — M9903 Segmental and somatic dysfunction of lumbar region: Secondary | ICD-10-CM | POA: Diagnosis not present

## 2019-12-07 ENCOUNTER — Other Ambulatory Visit: Payer: Self-pay

## 2019-12-07 ENCOUNTER — Ambulatory Visit
Admission: RE | Admit: 2019-12-07 | Discharge: 2019-12-07 | Disposition: A | Discharge status: Home / Self Care | Payer: Medicare Other | Source: Ambulatory Visit | Attending: Family Medicine | Admitting: Family Medicine

## 2019-12-07 DIAGNOSIS — Z1231 Encounter for screening mammogram for malignant neoplasm of breast: Secondary | ICD-10-CM

## 2019-12-07 DIAGNOSIS — Z780 Asymptomatic menopausal state: Secondary | ICD-10-CM | POA: Diagnosis not present

## 2019-12-07 DIAGNOSIS — E2839 Other primary ovarian failure: Secondary | ICD-10-CM

## 2019-12-07 DIAGNOSIS — Z13820 Encounter for screening for osteoporosis: Secondary | ICD-10-CM | POA: Diagnosis not present

## 2019-12-12 DIAGNOSIS — M9902 Segmental and somatic dysfunction of thoracic region: Secondary | ICD-10-CM | POA: Diagnosis not present

## 2019-12-12 DIAGNOSIS — M5136 Other intervertebral disc degeneration, lumbar region: Secondary | ICD-10-CM | POA: Diagnosis not present

## 2019-12-12 DIAGNOSIS — M5134 Other intervertebral disc degeneration, thoracic region: Secondary | ICD-10-CM | POA: Diagnosis not present

## 2019-12-12 DIAGNOSIS — M9903 Segmental and somatic dysfunction of lumbar region: Secondary | ICD-10-CM | POA: Diagnosis not present

## 2019-12-15 DIAGNOSIS — Z23 Encounter for immunization: Secondary | ICD-10-CM | POA: Diagnosis not present

## 2019-12-18 DIAGNOSIS — M9902 Segmental and somatic dysfunction of thoracic region: Secondary | ICD-10-CM | POA: Diagnosis not present

## 2019-12-18 DIAGNOSIS — M5136 Other intervertebral disc degeneration, lumbar region: Secondary | ICD-10-CM | POA: Diagnosis not present

## 2019-12-18 DIAGNOSIS — M9903 Segmental and somatic dysfunction of lumbar region: Secondary | ICD-10-CM | POA: Diagnosis not present

## 2019-12-18 DIAGNOSIS — M5134 Other intervertebral disc degeneration, thoracic region: Secondary | ICD-10-CM | POA: Diagnosis not present

## 2019-12-25 DIAGNOSIS — M5134 Other intervertebral disc degeneration, thoracic region: Secondary | ICD-10-CM | POA: Diagnosis not present

## 2019-12-25 DIAGNOSIS — M9902 Segmental and somatic dysfunction of thoracic region: Secondary | ICD-10-CM | POA: Diagnosis not present

## 2019-12-25 DIAGNOSIS — M9903 Segmental and somatic dysfunction of lumbar region: Secondary | ICD-10-CM | POA: Diagnosis not present

## 2019-12-25 DIAGNOSIS — M5136 Other intervertebral disc degeneration, lumbar region: Secondary | ICD-10-CM | POA: Diagnosis not present

## 2020-01-02 DIAGNOSIS — M5134 Other intervertebral disc degeneration, thoracic region: Secondary | ICD-10-CM | POA: Diagnosis not present

## 2020-01-02 DIAGNOSIS — M5136 Other intervertebral disc degeneration, lumbar region: Secondary | ICD-10-CM | POA: Diagnosis not present

## 2020-01-02 DIAGNOSIS — M9902 Segmental and somatic dysfunction of thoracic region: Secondary | ICD-10-CM | POA: Diagnosis not present

## 2020-01-02 DIAGNOSIS — M9903 Segmental and somatic dysfunction of lumbar region: Secondary | ICD-10-CM | POA: Diagnosis not present

## 2020-01-08 DIAGNOSIS — M5136 Other intervertebral disc degeneration, lumbar region: Secondary | ICD-10-CM | POA: Diagnosis not present

## 2020-01-08 DIAGNOSIS — M9902 Segmental and somatic dysfunction of thoracic region: Secondary | ICD-10-CM | POA: Diagnosis not present

## 2020-01-08 DIAGNOSIS — M5134 Other intervertebral disc degeneration, thoracic region: Secondary | ICD-10-CM | POA: Diagnosis not present

## 2020-01-08 DIAGNOSIS — M9903 Segmental and somatic dysfunction of lumbar region: Secondary | ICD-10-CM | POA: Diagnosis not present

## 2020-01-09 DIAGNOSIS — M5136 Other intervertebral disc degeneration, lumbar region: Secondary | ICD-10-CM | POA: Diagnosis not present

## 2020-01-09 DIAGNOSIS — M9903 Segmental and somatic dysfunction of lumbar region: Secondary | ICD-10-CM | POA: Diagnosis not present

## 2020-01-09 DIAGNOSIS — M5134 Other intervertebral disc degeneration, thoracic region: Secondary | ICD-10-CM | POA: Diagnosis not present

## 2020-01-09 DIAGNOSIS — M9902 Segmental and somatic dysfunction of thoracic region: Secondary | ICD-10-CM | POA: Diagnosis not present

## 2020-01-12 DIAGNOSIS — Z23 Encounter for immunization: Secondary | ICD-10-CM | POA: Diagnosis not present

## 2020-01-15 DIAGNOSIS — M9902 Segmental and somatic dysfunction of thoracic region: Secondary | ICD-10-CM | POA: Diagnosis not present

## 2020-01-15 DIAGNOSIS — M5134 Other intervertebral disc degeneration, thoracic region: Secondary | ICD-10-CM | POA: Diagnosis not present

## 2020-01-15 DIAGNOSIS — M5136 Other intervertebral disc degeneration, lumbar region: Secondary | ICD-10-CM | POA: Diagnosis not present

## 2020-01-15 DIAGNOSIS — M9903 Segmental and somatic dysfunction of lumbar region: Secondary | ICD-10-CM | POA: Diagnosis not present

## 2020-01-22 DIAGNOSIS — E113292 Type 2 diabetes mellitus with mild nonproliferative diabetic retinopathy without macular edema, left eye: Secondary | ICD-10-CM | POA: Diagnosis not present

## 2020-01-23 DIAGNOSIS — M9902 Segmental and somatic dysfunction of thoracic region: Secondary | ICD-10-CM | POA: Diagnosis not present

## 2020-01-23 DIAGNOSIS — M5134 Other intervertebral disc degeneration, thoracic region: Secondary | ICD-10-CM | POA: Diagnosis not present

## 2020-01-23 DIAGNOSIS — M5136 Other intervertebral disc degeneration, lumbar region: Secondary | ICD-10-CM | POA: Diagnosis not present

## 2020-01-23 DIAGNOSIS — M9903 Segmental and somatic dysfunction of lumbar region: Secondary | ICD-10-CM | POA: Diagnosis not present

## 2020-01-29 DIAGNOSIS — M9902 Segmental and somatic dysfunction of thoracic region: Secondary | ICD-10-CM | POA: Diagnosis not present

## 2020-01-29 DIAGNOSIS — M9903 Segmental and somatic dysfunction of lumbar region: Secondary | ICD-10-CM | POA: Diagnosis not present

## 2020-01-29 DIAGNOSIS — M5136 Other intervertebral disc degeneration, lumbar region: Secondary | ICD-10-CM | POA: Diagnosis not present

## 2020-01-29 DIAGNOSIS — M5134 Other intervertebral disc degeneration, thoracic region: Secondary | ICD-10-CM | POA: Diagnosis not present

## 2020-01-31 DIAGNOSIS — M9903 Segmental and somatic dysfunction of lumbar region: Secondary | ICD-10-CM | POA: Diagnosis not present

## 2020-01-31 DIAGNOSIS — M9902 Segmental and somatic dysfunction of thoracic region: Secondary | ICD-10-CM | POA: Diagnosis not present

## 2020-01-31 DIAGNOSIS — M5134 Other intervertebral disc degeneration, thoracic region: Secondary | ICD-10-CM | POA: Diagnosis not present

## 2020-01-31 DIAGNOSIS — M5136 Other intervertebral disc degeneration, lumbar region: Secondary | ICD-10-CM | POA: Diagnosis not present

## 2020-02-06 DIAGNOSIS — M9903 Segmental and somatic dysfunction of lumbar region: Secondary | ICD-10-CM | POA: Diagnosis not present

## 2020-02-06 DIAGNOSIS — M9902 Segmental and somatic dysfunction of thoracic region: Secondary | ICD-10-CM | POA: Diagnosis not present

## 2020-02-06 DIAGNOSIS — M5136 Other intervertebral disc degeneration, lumbar region: Secondary | ICD-10-CM | POA: Diagnosis not present

## 2020-02-06 DIAGNOSIS — M5134 Other intervertebral disc degeneration, thoracic region: Secondary | ICD-10-CM | POA: Diagnosis not present

## 2020-02-13 DIAGNOSIS — M5134 Other intervertebral disc degeneration, thoracic region: Secondary | ICD-10-CM | POA: Diagnosis not present

## 2020-02-13 DIAGNOSIS — M9903 Segmental and somatic dysfunction of lumbar region: Secondary | ICD-10-CM | POA: Diagnosis not present

## 2020-02-13 DIAGNOSIS — M9902 Segmental and somatic dysfunction of thoracic region: Secondary | ICD-10-CM | POA: Diagnosis not present

## 2020-02-13 DIAGNOSIS — M5136 Other intervertebral disc degeneration, lumbar region: Secondary | ICD-10-CM | POA: Diagnosis not present

## 2020-02-14 DIAGNOSIS — M5134 Other intervertebral disc degeneration, thoracic region: Secondary | ICD-10-CM | POA: Diagnosis not present

## 2020-02-14 DIAGNOSIS — M9902 Segmental and somatic dysfunction of thoracic region: Secondary | ICD-10-CM | POA: Diagnosis not present

## 2020-02-14 DIAGNOSIS — M5136 Other intervertebral disc degeneration, lumbar region: Secondary | ICD-10-CM | POA: Diagnosis not present

## 2020-02-14 DIAGNOSIS — M9903 Segmental and somatic dysfunction of lumbar region: Secondary | ICD-10-CM | POA: Diagnosis not present

## 2020-02-20 DIAGNOSIS — M9902 Segmental and somatic dysfunction of thoracic region: Secondary | ICD-10-CM | POA: Diagnosis not present

## 2020-02-20 DIAGNOSIS — M5136 Other intervertebral disc degeneration, lumbar region: Secondary | ICD-10-CM | POA: Diagnosis not present

## 2020-02-20 DIAGNOSIS — M9903 Segmental and somatic dysfunction of lumbar region: Secondary | ICD-10-CM | POA: Diagnosis not present

## 2020-02-20 DIAGNOSIS — M5134 Other intervertebral disc degeneration, thoracic region: Secondary | ICD-10-CM | POA: Diagnosis not present

## 2020-02-21 DIAGNOSIS — M9902 Segmental and somatic dysfunction of thoracic region: Secondary | ICD-10-CM | POA: Diagnosis not present

## 2020-02-21 DIAGNOSIS — M9903 Segmental and somatic dysfunction of lumbar region: Secondary | ICD-10-CM | POA: Diagnosis not present

## 2020-02-21 DIAGNOSIS — M5134 Other intervertebral disc degeneration, thoracic region: Secondary | ICD-10-CM | POA: Diagnosis not present

## 2020-02-21 DIAGNOSIS — M5136 Other intervertebral disc degeneration, lumbar region: Secondary | ICD-10-CM | POA: Diagnosis not present

## 2020-02-25 DIAGNOSIS — M9902 Segmental and somatic dysfunction of thoracic region: Secondary | ICD-10-CM | POA: Diagnosis not present

## 2020-02-25 DIAGNOSIS — M5136 Other intervertebral disc degeneration, lumbar region: Secondary | ICD-10-CM | POA: Diagnosis not present

## 2020-02-25 DIAGNOSIS — M5134 Other intervertebral disc degeneration, thoracic region: Secondary | ICD-10-CM | POA: Diagnosis not present

## 2020-02-25 DIAGNOSIS — M9903 Segmental and somatic dysfunction of lumbar region: Secondary | ICD-10-CM | POA: Diagnosis not present

## 2020-02-28 DIAGNOSIS — M5134 Other intervertebral disc degeneration, thoracic region: Secondary | ICD-10-CM | POA: Diagnosis not present

## 2020-02-28 DIAGNOSIS — M9903 Segmental and somatic dysfunction of lumbar region: Secondary | ICD-10-CM | POA: Diagnosis not present

## 2020-02-28 DIAGNOSIS — M5136 Other intervertebral disc degeneration, lumbar region: Secondary | ICD-10-CM | POA: Diagnosis not present

## 2020-02-28 DIAGNOSIS — M9902 Segmental and somatic dysfunction of thoracic region: Secondary | ICD-10-CM | POA: Diagnosis not present

## 2020-03-03 DIAGNOSIS — M9903 Segmental and somatic dysfunction of lumbar region: Secondary | ICD-10-CM | POA: Diagnosis not present

## 2020-03-03 DIAGNOSIS — M5134 Other intervertebral disc degeneration, thoracic region: Secondary | ICD-10-CM | POA: Diagnosis not present

## 2020-03-03 DIAGNOSIS — M5136 Other intervertebral disc degeneration, lumbar region: Secondary | ICD-10-CM | POA: Diagnosis not present

## 2020-03-03 DIAGNOSIS — M9902 Segmental and somatic dysfunction of thoracic region: Secondary | ICD-10-CM | POA: Diagnosis not present

## 2020-03-06 DIAGNOSIS — M9902 Segmental and somatic dysfunction of thoracic region: Secondary | ICD-10-CM | POA: Diagnosis not present

## 2020-03-06 DIAGNOSIS — M50322 Other cervical disc degeneration at C5-C6 level: Secondary | ICD-10-CM | POA: Diagnosis not present

## 2020-03-06 DIAGNOSIS — M9901 Segmental and somatic dysfunction of cervical region: Secondary | ICD-10-CM | POA: Diagnosis not present

## 2020-03-06 DIAGNOSIS — M5134 Other intervertebral disc degeneration, thoracic region: Secondary | ICD-10-CM | POA: Diagnosis not present

## 2020-03-11 DIAGNOSIS — Z23 Encounter for immunization: Secondary | ICD-10-CM | POA: Diagnosis not present

## 2020-03-11 DIAGNOSIS — E039 Hypothyroidism, unspecified: Secondary | ICD-10-CM | POA: Diagnosis not present

## 2020-03-11 DIAGNOSIS — E7800 Pure hypercholesterolemia, unspecified: Secondary | ICD-10-CM | POA: Diagnosis not present

## 2020-03-11 DIAGNOSIS — Z0001 Encounter for general adult medical examination with abnormal findings: Secondary | ICD-10-CM | POA: Diagnosis not present

## 2020-03-11 DIAGNOSIS — E113292 Type 2 diabetes mellitus with mild nonproliferative diabetic retinopathy without macular edema, left eye: Secondary | ICD-10-CM | POA: Diagnosis not present

## 2020-03-11 DIAGNOSIS — I10 Essential (primary) hypertension: Secondary | ICD-10-CM | POA: Diagnosis not present

## 2020-03-11 DIAGNOSIS — Z7984 Long term (current) use of oral hypoglycemic drugs: Secondary | ICD-10-CM | POA: Diagnosis not present

## 2020-03-11 DIAGNOSIS — M545 Low back pain: Secondary | ICD-10-CM | POA: Diagnosis not present

## 2020-03-11 DIAGNOSIS — Z79899 Other long term (current) drug therapy: Secondary | ICD-10-CM | POA: Diagnosis not present

## 2020-03-12 DIAGNOSIS — M9901 Segmental and somatic dysfunction of cervical region: Secondary | ICD-10-CM | POA: Diagnosis not present

## 2020-03-12 DIAGNOSIS — M50322 Other cervical disc degeneration at C5-C6 level: Secondary | ICD-10-CM | POA: Diagnosis not present

## 2020-03-12 DIAGNOSIS — M5134 Other intervertebral disc degeneration, thoracic region: Secondary | ICD-10-CM | POA: Diagnosis not present

## 2020-03-12 DIAGNOSIS — M9902 Segmental and somatic dysfunction of thoracic region: Secondary | ICD-10-CM | POA: Diagnosis not present

## 2020-03-19 DIAGNOSIS — M5134 Other intervertebral disc degeneration, thoracic region: Secondary | ICD-10-CM | POA: Diagnosis not present

## 2020-03-19 DIAGNOSIS — M9901 Segmental and somatic dysfunction of cervical region: Secondary | ICD-10-CM | POA: Diagnosis not present

## 2020-03-19 DIAGNOSIS — M9902 Segmental and somatic dysfunction of thoracic region: Secondary | ICD-10-CM | POA: Diagnosis not present

## 2020-03-19 DIAGNOSIS — M50322 Other cervical disc degeneration at C5-C6 level: Secondary | ICD-10-CM | POA: Diagnosis not present

## 2020-03-26 DIAGNOSIS — M50322 Other cervical disc degeneration at C5-C6 level: Secondary | ICD-10-CM | POA: Diagnosis not present

## 2020-03-26 DIAGNOSIS — M9901 Segmental and somatic dysfunction of cervical region: Secondary | ICD-10-CM | POA: Diagnosis not present

## 2020-03-26 DIAGNOSIS — M9902 Segmental and somatic dysfunction of thoracic region: Secondary | ICD-10-CM | POA: Diagnosis not present

## 2020-03-26 DIAGNOSIS — M5134 Other intervertebral disc degeneration, thoracic region: Secondary | ICD-10-CM | POA: Diagnosis not present

## 2020-04-02 DIAGNOSIS — M9901 Segmental and somatic dysfunction of cervical region: Secondary | ICD-10-CM | POA: Diagnosis not present

## 2020-04-02 DIAGNOSIS — M9902 Segmental and somatic dysfunction of thoracic region: Secondary | ICD-10-CM | POA: Diagnosis not present

## 2020-04-02 DIAGNOSIS — M50322 Other cervical disc degeneration at C5-C6 level: Secondary | ICD-10-CM | POA: Diagnosis not present

## 2020-04-02 DIAGNOSIS — M5134 Other intervertebral disc degeneration, thoracic region: Secondary | ICD-10-CM | POA: Diagnosis not present

## 2020-04-07 DIAGNOSIS — M9901 Segmental and somatic dysfunction of cervical region: Secondary | ICD-10-CM | POA: Diagnosis not present

## 2020-04-07 DIAGNOSIS — M5134 Other intervertebral disc degeneration, thoracic region: Secondary | ICD-10-CM | POA: Diagnosis not present

## 2020-04-07 DIAGNOSIS — M50322 Other cervical disc degeneration at C5-C6 level: Secondary | ICD-10-CM | POA: Diagnosis not present

## 2020-04-07 DIAGNOSIS — M9902 Segmental and somatic dysfunction of thoracic region: Secondary | ICD-10-CM | POA: Diagnosis not present

## 2020-04-10 DIAGNOSIS — M9901 Segmental and somatic dysfunction of cervical region: Secondary | ICD-10-CM | POA: Diagnosis not present

## 2020-04-10 DIAGNOSIS — M50322 Other cervical disc degeneration at C5-C6 level: Secondary | ICD-10-CM | POA: Diagnosis not present

## 2020-04-10 DIAGNOSIS — M5134 Other intervertebral disc degeneration, thoracic region: Secondary | ICD-10-CM | POA: Diagnosis not present

## 2020-04-10 DIAGNOSIS — M9902 Segmental and somatic dysfunction of thoracic region: Secondary | ICD-10-CM | POA: Diagnosis not present

## 2020-04-16 DIAGNOSIS — M9902 Segmental and somatic dysfunction of thoracic region: Secondary | ICD-10-CM | POA: Diagnosis not present

## 2020-04-16 DIAGNOSIS — M5134 Other intervertebral disc degeneration, thoracic region: Secondary | ICD-10-CM | POA: Diagnosis not present

## 2020-04-16 DIAGNOSIS — M50322 Other cervical disc degeneration at C5-C6 level: Secondary | ICD-10-CM | POA: Diagnosis not present

## 2020-04-16 DIAGNOSIS — M9901 Segmental and somatic dysfunction of cervical region: Secondary | ICD-10-CM | POA: Diagnosis not present

## 2020-05-14 DIAGNOSIS — M5136 Other intervertebral disc degeneration, lumbar region: Secondary | ICD-10-CM | POA: Diagnosis not present

## 2020-05-14 DIAGNOSIS — M9905 Segmental and somatic dysfunction of pelvic region: Secondary | ICD-10-CM | POA: Diagnosis not present

## 2020-05-14 DIAGNOSIS — M9904 Segmental and somatic dysfunction of sacral region: Secondary | ICD-10-CM | POA: Diagnosis not present

## 2020-05-14 DIAGNOSIS — M9903 Segmental and somatic dysfunction of lumbar region: Secondary | ICD-10-CM | POA: Diagnosis not present

## 2020-05-22 DIAGNOSIS — M9904 Segmental and somatic dysfunction of sacral region: Secondary | ICD-10-CM | POA: Diagnosis not present

## 2020-05-22 DIAGNOSIS — M9905 Segmental and somatic dysfunction of pelvic region: Secondary | ICD-10-CM | POA: Diagnosis not present

## 2020-05-22 DIAGNOSIS — M5136 Other intervertebral disc degeneration, lumbar region: Secondary | ICD-10-CM | POA: Diagnosis not present

## 2020-05-22 DIAGNOSIS — M9903 Segmental and somatic dysfunction of lumbar region: Secondary | ICD-10-CM | POA: Diagnosis not present

## 2020-05-28 DIAGNOSIS — M9904 Segmental and somatic dysfunction of sacral region: Secondary | ICD-10-CM | POA: Diagnosis not present

## 2020-05-28 DIAGNOSIS — M9905 Segmental and somatic dysfunction of pelvic region: Secondary | ICD-10-CM | POA: Diagnosis not present

## 2020-05-28 DIAGNOSIS — M9903 Segmental and somatic dysfunction of lumbar region: Secondary | ICD-10-CM | POA: Diagnosis not present

## 2020-05-28 DIAGNOSIS — M5136 Other intervertebral disc degeneration, lumbar region: Secondary | ICD-10-CM | POA: Diagnosis not present

## 2020-06-04 DIAGNOSIS — M9905 Segmental and somatic dysfunction of pelvic region: Secondary | ICD-10-CM | POA: Diagnosis not present

## 2020-06-04 DIAGNOSIS — M9904 Segmental and somatic dysfunction of sacral region: Secondary | ICD-10-CM | POA: Diagnosis not present

## 2020-06-04 DIAGNOSIS — M5136 Other intervertebral disc degeneration, lumbar region: Secondary | ICD-10-CM | POA: Diagnosis not present

## 2020-06-04 DIAGNOSIS — M9903 Segmental and somatic dysfunction of lumbar region: Secondary | ICD-10-CM | POA: Diagnosis not present

## 2020-06-11 DIAGNOSIS — M5136 Other intervertebral disc degeneration, lumbar region: Secondary | ICD-10-CM | POA: Diagnosis not present

## 2020-06-11 DIAGNOSIS — M9905 Segmental and somatic dysfunction of pelvic region: Secondary | ICD-10-CM | POA: Diagnosis not present

## 2020-06-11 DIAGNOSIS — M9904 Segmental and somatic dysfunction of sacral region: Secondary | ICD-10-CM | POA: Diagnosis not present

## 2020-06-11 DIAGNOSIS — M9903 Segmental and somatic dysfunction of lumbar region: Secondary | ICD-10-CM | POA: Diagnosis not present

## 2020-06-18 DIAGNOSIS — M5136 Other intervertebral disc degeneration, lumbar region: Secondary | ICD-10-CM | POA: Diagnosis not present

## 2020-06-18 DIAGNOSIS — M9905 Segmental and somatic dysfunction of pelvic region: Secondary | ICD-10-CM | POA: Diagnosis not present

## 2020-06-18 DIAGNOSIS — M9904 Segmental and somatic dysfunction of sacral region: Secondary | ICD-10-CM | POA: Diagnosis not present

## 2020-06-18 DIAGNOSIS — M9903 Segmental and somatic dysfunction of lumbar region: Secondary | ICD-10-CM | POA: Diagnosis not present

## 2020-06-25 DIAGNOSIS — M9903 Segmental and somatic dysfunction of lumbar region: Secondary | ICD-10-CM | POA: Diagnosis not present

## 2020-06-25 DIAGNOSIS — M9904 Segmental and somatic dysfunction of sacral region: Secondary | ICD-10-CM | POA: Diagnosis not present

## 2020-06-25 DIAGNOSIS — M5136 Other intervertebral disc degeneration, lumbar region: Secondary | ICD-10-CM | POA: Diagnosis not present

## 2020-06-25 DIAGNOSIS — M9905 Segmental and somatic dysfunction of pelvic region: Secondary | ICD-10-CM | POA: Diagnosis not present

## 2020-07-02 DIAGNOSIS — M9905 Segmental and somatic dysfunction of pelvic region: Secondary | ICD-10-CM | POA: Diagnosis not present

## 2020-07-02 DIAGNOSIS — M9904 Segmental and somatic dysfunction of sacral region: Secondary | ICD-10-CM | POA: Diagnosis not present

## 2020-07-02 DIAGNOSIS — M5136 Other intervertebral disc degeneration, lumbar region: Secondary | ICD-10-CM | POA: Diagnosis not present

## 2020-07-02 DIAGNOSIS — M9903 Segmental and somatic dysfunction of lumbar region: Secondary | ICD-10-CM | POA: Diagnosis not present

## 2020-07-09 DIAGNOSIS — M9905 Segmental and somatic dysfunction of pelvic region: Secondary | ICD-10-CM | POA: Diagnosis not present

## 2020-07-09 DIAGNOSIS — M9904 Segmental and somatic dysfunction of sacral region: Secondary | ICD-10-CM | POA: Diagnosis not present

## 2020-07-09 DIAGNOSIS — M9903 Segmental and somatic dysfunction of lumbar region: Secondary | ICD-10-CM | POA: Diagnosis not present

## 2020-07-09 DIAGNOSIS — M5136 Other intervertebral disc degeneration, lumbar region: Secondary | ICD-10-CM | POA: Diagnosis not present

## 2020-07-10 DIAGNOSIS — M9904 Segmental and somatic dysfunction of sacral region: Secondary | ICD-10-CM | POA: Diagnosis not present

## 2020-07-10 DIAGNOSIS — Z23 Encounter for immunization: Secondary | ICD-10-CM | POA: Diagnosis not present

## 2020-07-10 DIAGNOSIS — M9905 Segmental and somatic dysfunction of pelvic region: Secondary | ICD-10-CM | POA: Diagnosis not present

## 2020-07-10 DIAGNOSIS — M9903 Segmental and somatic dysfunction of lumbar region: Secondary | ICD-10-CM | POA: Diagnosis not present

## 2020-07-10 DIAGNOSIS — M5136 Other intervertebral disc degeneration, lumbar region: Secondary | ICD-10-CM | POA: Diagnosis not present

## 2020-07-16 DIAGNOSIS — M9903 Segmental and somatic dysfunction of lumbar region: Secondary | ICD-10-CM | POA: Diagnosis not present

## 2020-07-16 DIAGNOSIS — M9904 Segmental and somatic dysfunction of sacral region: Secondary | ICD-10-CM | POA: Diagnosis not present

## 2020-07-16 DIAGNOSIS — M9905 Segmental and somatic dysfunction of pelvic region: Secondary | ICD-10-CM | POA: Diagnosis not present

## 2020-07-16 DIAGNOSIS — M5136 Other intervertebral disc degeneration, lumbar region: Secondary | ICD-10-CM | POA: Diagnosis not present

## 2020-07-23 DIAGNOSIS — M9905 Segmental and somatic dysfunction of pelvic region: Secondary | ICD-10-CM | POA: Diagnosis not present

## 2020-07-23 DIAGNOSIS — M5136 Other intervertebral disc degeneration, lumbar region: Secondary | ICD-10-CM | POA: Diagnosis not present

## 2020-07-23 DIAGNOSIS — M9903 Segmental and somatic dysfunction of lumbar region: Secondary | ICD-10-CM | POA: Diagnosis not present

## 2020-07-23 DIAGNOSIS — M9904 Segmental and somatic dysfunction of sacral region: Secondary | ICD-10-CM | POA: Diagnosis not present

## 2020-07-30 DIAGNOSIS — M9905 Segmental and somatic dysfunction of pelvic region: Secondary | ICD-10-CM | POA: Diagnosis not present

## 2020-07-30 DIAGNOSIS — M5136 Other intervertebral disc degeneration, lumbar region: Secondary | ICD-10-CM | POA: Diagnosis not present

## 2020-07-30 DIAGNOSIS — M9904 Segmental and somatic dysfunction of sacral region: Secondary | ICD-10-CM | POA: Diagnosis not present

## 2020-07-30 DIAGNOSIS — M9903 Segmental and somatic dysfunction of lumbar region: Secondary | ICD-10-CM | POA: Diagnosis not present

## 2020-08-04 DIAGNOSIS — M9905 Segmental and somatic dysfunction of pelvic region: Secondary | ICD-10-CM | POA: Diagnosis not present

## 2020-08-04 DIAGNOSIS — M9903 Segmental and somatic dysfunction of lumbar region: Secondary | ICD-10-CM | POA: Diagnosis not present

## 2020-08-04 DIAGNOSIS — M9904 Segmental and somatic dysfunction of sacral region: Secondary | ICD-10-CM | POA: Diagnosis not present

## 2020-08-04 DIAGNOSIS — M5136 Other intervertebral disc degeneration, lumbar region: Secondary | ICD-10-CM | POA: Diagnosis not present

## 2020-08-06 DIAGNOSIS — M5031 Other cervical disc degeneration,  high cervical region: Secondary | ICD-10-CM | POA: Diagnosis not present

## 2020-08-06 DIAGNOSIS — M9901 Segmental and somatic dysfunction of cervical region: Secondary | ICD-10-CM | POA: Diagnosis not present

## 2020-08-06 DIAGNOSIS — M9903 Segmental and somatic dysfunction of lumbar region: Secondary | ICD-10-CM | POA: Diagnosis not present

## 2020-08-06 DIAGNOSIS — M5136 Other intervertebral disc degeneration, lumbar region: Secondary | ICD-10-CM | POA: Diagnosis not present

## 2020-08-13 DIAGNOSIS — M9901 Segmental and somatic dysfunction of cervical region: Secondary | ICD-10-CM | POA: Diagnosis not present

## 2020-08-13 DIAGNOSIS — M9903 Segmental and somatic dysfunction of lumbar region: Secondary | ICD-10-CM | POA: Diagnosis not present

## 2020-08-13 DIAGNOSIS — M5031 Other cervical disc degeneration,  high cervical region: Secondary | ICD-10-CM | POA: Diagnosis not present

## 2020-08-13 DIAGNOSIS — M5136 Other intervertebral disc degeneration, lumbar region: Secondary | ICD-10-CM | POA: Diagnosis not present

## 2020-08-20 DIAGNOSIS — M9905 Segmental and somatic dysfunction of pelvic region: Secondary | ICD-10-CM | POA: Diagnosis not present

## 2020-08-20 DIAGNOSIS — M9903 Segmental and somatic dysfunction of lumbar region: Secondary | ICD-10-CM | POA: Diagnosis not present

## 2020-08-20 DIAGNOSIS — M5136 Other intervertebral disc degeneration, lumbar region: Secondary | ICD-10-CM | POA: Diagnosis not present

## 2020-08-20 DIAGNOSIS — M9904 Segmental and somatic dysfunction of sacral region: Secondary | ICD-10-CM | POA: Diagnosis not present

## 2020-08-27 DIAGNOSIS — M9905 Segmental and somatic dysfunction of pelvic region: Secondary | ICD-10-CM | POA: Diagnosis not present

## 2020-08-27 DIAGNOSIS — M9903 Segmental and somatic dysfunction of lumbar region: Secondary | ICD-10-CM | POA: Diagnosis not present

## 2020-08-27 DIAGNOSIS — M9904 Segmental and somatic dysfunction of sacral region: Secondary | ICD-10-CM | POA: Diagnosis not present

## 2020-08-27 DIAGNOSIS — M5136 Other intervertebral disc degeneration, lumbar region: Secondary | ICD-10-CM | POA: Diagnosis not present

## 2020-08-28 DIAGNOSIS — Z23 Encounter for immunization: Secondary | ICD-10-CM | POA: Diagnosis not present

## 2020-09-01 DIAGNOSIS — M9905 Segmental and somatic dysfunction of pelvic region: Secondary | ICD-10-CM | POA: Diagnosis not present

## 2020-09-01 DIAGNOSIS — M9903 Segmental and somatic dysfunction of lumbar region: Secondary | ICD-10-CM | POA: Diagnosis not present

## 2020-09-01 DIAGNOSIS — M9904 Segmental and somatic dysfunction of sacral region: Secondary | ICD-10-CM | POA: Diagnosis not present

## 2020-09-01 DIAGNOSIS — M5136 Other intervertebral disc degeneration, lumbar region: Secondary | ICD-10-CM | POA: Diagnosis not present

## 2020-09-08 DIAGNOSIS — M9904 Segmental and somatic dysfunction of sacral region: Secondary | ICD-10-CM | POA: Diagnosis not present

## 2020-09-08 DIAGNOSIS — M5136 Other intervertebral disc degeneration, lumbar region: Secondary | ICD-10-CM | POA: Diagnosis not present

## 2020-09-08 DIAGNOSIS — M9905 Segmental and somatic dysfunction of pelvic region: Secondary | ICD-10-CM | POA: Diagnosis not present

## 2020-09-08 DIAGNOSIS — M9903 Segmental and somatic dysfunction of lumbar region: Secondary | ICD-10-CM | POA: Diagnosis not present

## 2020-09-10 DIAGNOSIS — M9903 Segmental and somatic dysfunction of lumbar region: Secondary | ICD-10-CM | POA: Diagnosis not present

## 2020-09-10 DIAGNOSIS — M9904 Segmental and somatic dysfunction of sacral region: Secondary | ICD-10-CM | POA: Diagnosis not present

## 2020-09-10 DIAGNOSIS — M5136 Other intervertebral disc degeneration, lumbar region: Secondary | ICD-10-CM | POA: Diagnosis not present

## 2020-09-10 DIAGNOSIS — M9905 Segmental and somatic dysfunction of pelvic region: Secondary | ICD-10-CM | POA: Diagnosis not present

## 2020-09-12 DIAGNOSIS — E1169 Type 2 diabetes mellitus with other specified complication: Secondary | ICD-10-CM | POA: Diagnosis not present

## 2020-09-12 DIAGNOSIS — E7800 Pure hypercholesterolemia, unspecified: Secondary | ICD-10-CM | POA: Diagnosis not present

## 2020-09-12 DIAGNOSIS — Z79899 Other long term (current) drug therapy: Secondary | ICD-10-CM | POA: Diagnosis not present

## 2020-09-12 DIAGNOSIS — Z7984 Long term (current) use of oral hypoglycemic drugs: Secondary | ICD-10-CM | POA: Diagnosis not present

## 2020-09-12 DIAGNOSIS — I700 Atherosclerosis of aorta: Secondary | ICD-10-CM | POA: Diagnosis not present

## 2020-09-12 DIAGNOSIS — E039 Hypothyroidism, unspecified: Secondary | ICD-10-CM | POA: Diagnosis not present

## 2020-09-12 DIAGNOSIS — M5451 Vertebrogenic low back pain: Secondary | ICD-10-CM | POA: Diagnosis not present

## 2020-09-12 DIAGNOSIS — I10 Essential (primary) hypertension: Secondary | ICD-10-CM | POA: Diagnosis not present

## 2020-09-12 DIAGNOSIS — E113292 Type 2 diabetes mellitus with mild nonproliferative diabetic retinopathy without macular edema, left eye: Secondary | ICD-10-CM | POA: Diagnosis not present

## 2020-09-17 DIAGNOSIS — M9904 Segmental and somatic dysfunction of sacral region: Secondary | ICD-10-CM | POA: Diagnosis not present

## 2020-09-17 DIAGNOSIS — M9903 Segmental and somatic dysfunction of lumbar region: Secondary | ICD-10-CM | POA: Diagnosis not present

## 2020-09-17 DIAGNOSIS — M5136 Other intervertebral disc degeneration, lumbar region: Secondary | ICD-10-CM | POA: Diagnosis not present

## 2020-09-17 DIAGNOSIS — M9905 Segmental and somatic dysfunction of pelvic region: Secondary | ICD-10-CM | POA: Diagnosis not present

## 2020-09-24 DIAGNOSIS — M9904 Segmental and somatic dysfunction of sacral region: Secondary | ICD-10-CM | POA: Diagnosis not present

## 2020-09-24 DIAGNOSIS — M9905 Segmental and somatic dysfunction of pelvic region: Secondary | ICD-10-CM | POA: Diagnosis not present

## 2020-09-24 DIAGNOSIS — M9903 Segmental and somatic dysfunction of lumbar region: Secondary | ICD-10-CM | POA: Diagnosis not present

## 2020-09-24 DIAGNOSIS — M5136 Other intervertebral disc degeneration, lumbar region: Secondary | ICD-10-CM | POA: Diagnosis not present

## 2020-10-01 DIAGNOSIS — M9903 Segmental and somatic dysfunction of lumbar region: Secondary | ICD-10-CM | POA: Diagnosis not present

## 2020-10-01 DIAGNOSIS — M5136 Other intervertebral disc degeneration, lumbar region: Secondary | ICD-10-CM | POA: Diagnosis not present

## 2020-10-01 DIAGNOSIS — M9905 Segmental and somatic dysfunction of pelvic region: Secondary | ICD-10-CM | POA: Diagnosis not present

## 2020-10-01 DIAGNOSIS — M9904 Segmental and somatic dysfunction of sacral region: Secondary | ICD-10-CM | POA: Diagnosis not present

## 2020-10-08 DIAGNOSIS — M9904 Segmental and somatic dysfunction of sacral region: Secondary | ICD-10-CM | POA: Diagnosis not present

## 2020-10-08 DIAGNOSIS — M9905 Segmental and somatic dysfunction of pelvic region: Secondary | ICD-10-CM | POA: Diagnosis not present

## 2020-10-08 DIAGNOSIS — M9903 Segmental and somatic dysfunction of lumbar region: Secondary | ICD-10-CM | POA: Diagnosis not present

## 2020-10-08 DIAGNOSIS — M5136 Other intervertebral disc degeneration, lumbar region: Secondary | ICD-10-CM | POA: Diagnosis not present

## 2020-10-14 DIAGNOSIS — M5136 Other intervertebral disc degeneration, lumbar region: Secondary | ICD-10-CM | POA: Diagnosis not present

## 2020-10-14 DIAGNOSIS — M9904 Segmental and somatic dysfunction of sacral region: Secondary | ICD-10-CM | POA: Diagnosis not present

## 2020-10-14 DIAGNOSIS — M9905 Segmental and somatic dysfunction of pelvic region: Secondary | ICD-10-CM | POA: Diagnosis not present

## 2020-10-14 DIAGNOSIS — M9903 Segmental and somatic dysfunction of lumbar region: Secondary | ICD-10-CM | POA: Diagnosis not present

## 2020-10-15 DIAGNOSIS — M9905 Segmental and somatic dysfunction of pelvic region: Secondary | ICD-10-CM | POA: Diagnosis not present

## 2020-10-15 DIAGNOSIS — M9904 Segmental and somatic dysfunction of sacral region: Secondary | ICD-10-CM | POA: Diagnosis not present

## 2020-10-15 DIAGNOSIS — M9903 Segmental and somatic dysfunction of lumbar region: Secondary | ICD-10-CM | POA: Diagnosis not present

## 2020-10-15 DIAGNOSIS — M5136 Other intervertebral disc degeneration, lumbar region: Secondary | ICD-10-CM | POA: Diagnosis not present

## 2020-10-22 DIAGNOSIS — M9904 Segmental and somatic dysfunction of sacral region: Secondary | ICD-10-CM | POA: Diagnosis not present

## 2020-10-22 DIAGNOSIS — M9905 Segmental and somatic dysfunction of pelvic region: Secondary | ICD-10-CM | POA: Diagnosis not present

## 2020-10-22 DIAGNOSIS — M9903 Segmental and somatic dysfunction of lumbar region: Secondary | ICD-10-CM | POA: Diagnosis not present

## 2020-10-22 DIAGNOSIS — M5136 Other intervertebral disc degeneration, lumbar region: Secondary | ICD-10-CM | POA: Diagnosis not present

## 2020-10-28 ENCOUNTER — Other Ambulatory Visit: Payer: Self-pay | Admitting: Family Medicine

## 2020-10-28 DIAGNOSIS — Z1231 Encounter for screening mammogram for malignant neoplasm of breast: Secondary | ICD-10-CM

## 2020-10-29 DIAGNOSIS — M9904 Segmental and somatic dysfunction of sacral region: Secondary | ICD-10-CM | POA: Diagnosis not present

## 2020-10-29 DIAGNOSIS — M9905 Segmental and somatic dysfunction of pelvic region: Secondary | ICD-10-CM | POA: Diagnosis not present

## 2020-10-29 DIAGNOSIS — M9903 Segmental and somatic dysfunction of lumbar region: Secondary | ICD-10-CM | POA: Diagnosis not present

## 2020-10-29 DIAGNOSIS — M5136 Other intervertebral disc degeneration, lumbar region: Secondary | ICD-10-CM | POA: Diagnosis not present

## 2020-11-12 DIAGNOSIS — M5136 Other intervertebral disc degeneration, lumbar region: Secondary | ICD-10-CM | POA: Diagnosis not present

## 2020-11-12 DIAGNOSIS — M9905 Segmental and somatic dysfunction of pelvic region: Secondary | ICD-10-CM | POA: Diagnosis not present

## 2020-11-12 DIAGNOSIS — M9904 Segmental and somatic dysfunction of sacral region: Secondary | ICD-10-CM | POA: Diagnosis not present

## 2020-11-12 DIAGNOSIS — M9903 Segmental and somatic dysfunction of lumbar region: Secondary | ICD-10-CM | POA: Diagnosis not present

## 2020-11-19 DIAGNOSIS — M9904 Segmental and somatic dysfunction of sacral region: Secondary | ICD-10-CM | POA: Diagnosis not present

## 2020-11-19 DIAGNOSIS — M5136 Other intervertebral disc degeneration, lumbar region: Secondary | ICD-10-CM | POA: Diagnosis not present

## 2020-11-19 DIAGNOSIS — M9903 Segmental and somatic dysfunction of lumbar region: Secondary | ICD-10-CM | POA: Diagnosis not present

## 2020-11-19 DIAGNOSIS — M9905 Segmental and somatic dysfunction of pelvic region: Secondary | ICD-10-CM | POA: Diagnosis not present

## 2020-11-20 DIAGNOSIS — M5136 Other intervertebral disc degeneration, lumbar region: Secondary | ICD-10-CM | POA: Diagnosis not present

## 2020-11-20 DIAGNOSIS — M9904 Segmental and somatic dysfunction of sacral region: Secondary | ICD-10-CM | POA: Diagnosis not present

## 2020-11-20 DIAGNOSIS — M9905 Segmental and somatic dysfunction of pelvic region: Secondary | ICD-10-CM | POA: Diagnosis not present

## 2020-11-20 DIAGNOSIS — M9903 Segmental and somatic dysfunction of lumbar region: Secondary | ICD-10-CM | POA: Diagnosis not present

## 2020-11-26 DIAGNOSIS — M9903 Segmental and somatic dysfunction of lumbar region: Secondary | ICD-10-CM | POA: Diagnosis not present

## 2020-11-26 DIAGNOSIS — M5136 Other intervertebral disc degeneration, lumbar region: Secondary | ICD-10-CM | POA: Diagnosis not present

## 2020-11-26 DIAGNOSIS — M9905 Segmental and somatic dysfunction of pelvic region: Secondary | ICD-10-CM | POA: Diagnosis not present

## 2020-11-26 DIAGNOSIS — M9904 Segmental and somatic dysfunction of sacral region: Secondary | ICD-10-CM | POA: Diagnosis not present

## 2020-12-03 DIAGNOSIS — M1712 Unilateral primary osteoarthritis, left knee: Secondary | ICD-10-CM | POA: Diagnosis not present

## 2020-12-03 DIAGNOSIS — R600 Localized edema: Secondary | ICD-10-CM | POA: Diagnosis not present

## 2020-12-03 DIAGNOSIS — S8992XA Unspecified injury of left lower leg, initial encounter: Secondary | ICD-10-CM | POA: Diagnosis not present

## 2020-12-03 DIAGNOSIS — S79912A Unspecified injury of left hip, initial encounter: Secondary | ICD-10-CM | POA: Diagnosis not present

## 2020-12-08 ENCOUNTER — Ambulatory Visit: Payer: Medicare Other

## 2020-12-08 DIAGNOSIS — M9905 Segmental and somatic dysfunction of pelvic region: Secondary | ICD-10-CM | POA: Diagnosis not present

## 2020-12-08 DIAGNOSIS — M9904 Segmental and somatic dysfunction of sacral region: Secondary | ICD-10-CM | POA: Diagnosis not present

## 2020-12-08 DIAGNOSIS — M9903 Segmental and somatic dysfunction of lumbar region: Secondary | ICD-10-CM | POA: Diagnosis not present

## 2020-12-08 DIAGNOSIS — M5136 Other intervertebral disc degeneration, lumbar region: Secondary | ICD-10-CM | POA: Diagnosis not present

## 2020-12-10 DIAGNOSIS — M5136 Other intervertebral disc degeneration, lumbar region: Secondary | ICD-10-CM | POA: Diagnosis not present

## 2020-12-10 DIAGNOSIS — M9903 Segmental and somatic dysfunction of lumbar region: Secondary | ICD-10-CM | POA: Diagnosis not present

## 2020-12-10 DIAGNOSIS — M9904 Segmental and somatic dysfunction of sacral region: Secondary | ICD-10-CM | POA: Diagnosis not present

## 2020-12-10 DIAGNOSIS — M9905 Segmental and somatic dysfunction of pelvic region: Secondary | ICD-10-CM | POA: Diagnosis not present

## 2020-12-17 DIAGNOSIS — M5136 Other intervertebral disc degeneration, lumbar region: Secondary | ICD-10-CM | POA: Diagnosis not present

## 2020-12-17 DIAGNOSIS — M9903 Segmental and somatic dysfunction of lumbar region: Secondary | ICD-10-CM | POA: Diagnosis not present

## 2020-12-17 DIAGNOSIS — M9905 Segmental and somatic dysfunction of pelvic region: Secondary | ICD-10-CM | POA: Diagnosis not present

## 2020-12-17 DIAGNOSIS — M9904 Segmental and somatic dysfunction of sacral region: Secondary | ICD-10-CM | POA: Diagnosis not present

## 2020-12-24 DIAGNOSIS — M9904 Segmental and somatic dysfunction of sacral region: Secondary | ICD-10-CM | POA: Diagnosis not present

## 2020-12-24 DIAGNOSIS — M9903 Segmental and somatic dysfunction of lumbar region: Secondary | ICD-10-CM | POA: Diagnosis not present

## 2020-12-24 DIAGNOSIS — M5136 Other intervertebral disc degeneration, lumbar region: Secondary | ICD-10-CM | POA: Diagnosis not present

## 2020-12-24 DIAGNOSIS — M9905 Segmental and somatic dysfunction of pelvic region: Secondary | ICD-10-CM | POA: Diagnosis not present

## 2020-12-31 DIAGNOSIS — M9903 Segmental and somatic dysfunction of lumbar region: Secondary | ICD-10-CM | POA: Diagnosis not present

## 2020-12-31 DIAGNOSIS — M5136 Other intervertebral disc degeneration, lumbar region: Secondary | ICD-10-CM | POA: Diagnosis not present

## 2020-12-31 DIAGNOSIS — M9905 Segmental and somatic dysfunction of pelvic region: Secondary | ICD-10-CM | POA: Diagnosis not present

## 2020-12-31 DIAGNOSIS — M9904 Segmental and somatic dysfunction of sacral region: Secondary | ICD-10-CM | POA: Diagnosis not present

## 2021-01-07 DIAGNOSIS — M9905 Segmental and somatic dysfunction of pelvic region: Secondary | ICD-10-CM | POA: Diagnosis not present

## 2021-01-07 DIAGNOSIS — M5136 Other intervertebral disc degeneration, lumbar region: Secondary | ICD-10-CM | POA: Diagnosis not present

## 2021-01-07 DIAGNOSIS — M9904 Segmental and somatic dysfunction of sacral region: Secondary | ICD-10-CM | POA: Diagnosis not present

## 2021-01-07 DIAGNOSIS — M9903 Segmental and somatic dysfunction of lumbar region: Secondary | ICD-10-CM | POA: Diagnosis not present

## 2021-01-14 DIAGNOSIS — M5136 Other intervertebral disc degeneration, lumbar region: Secondary | ICD-10-CM | POA: Diagnosis not present

## 2021-01-14 DIAGNOSIS — M9904 Segmental and somatic dysfunction of sacral region: Secondary | ICD-10-CM | POA: Diagnosis not present

## 2021-01-14 DIAGNOSIS — M9903 Segmental and somatic dysfunction of lumbar region: Secondary | ICD-10-CM | POA: Diagnosis not present

## 2021-01-14 DIAGNOSIS — M9905 Segmental and somatic dysfunction of pelvic region: Secondary | ICD-10-CM | POA: Diagnosis not present

## 2021-01-20 ENCOUNTER — Ambulatory Visit
Admission: RE | Admit: 2021-01-20 | Discharge: 2021-01-20 | Disposition: A | Discharge status: Home / Self Care | Payer: Medicare Other | Source: Ambulatory Visit | Attending: Family Medicine | Admitting: Family Medicine

## 2021-01-20 ENCOUNTER — Other Ambulatory Visit: Payer: Self-pay

## 2021-01-20 DIAGNOSIS — Z1231 Encounter for screening mammogram for malignant neoplasm of breast: Secondary | ICD-10-CM

## 2021-01-21 DIAGNOSIS — M9905 Segmental and somatic dysfunction of pelvic region: Secondary | ICD-10-CM | POA: Diagnosis not present

## 2021-01-21 DIAGNOSIS — M9904 Segmental and somatic dysfunction of sacral region: Secondary | ICD-10-CM | POA: Diagnosis not present

## 2021-01-21 DIAGNOSIS — M9903 Segmental and somatic dysfunction of lumbar region: Secondary | ICD-10-CM | POA: Diagnosis not present

## 2021-01-21 DIAGNOSIS — M5136 Other intervertebral disc degeneration, lumbar region: Secondary | ICD-10-CM | POA: Diagnosis not present

## 2021-01-22 DIAGNOSIS — E11319 Type 2 diabetes mellitus with unspecified diabetic retinopathy without macular edema: Secondary | ICD-10-CM | POA: Diagnosis not present

## 2021-01-28 DIAGNOSIS — M5136 Other intervertebral disc degeneration, lumbar region: Secondary | ICD-10-CM | POA: Diagnosis not present

## 2021-01-28 DIAGNOSIS — M9903 Segmental and somatic dysfunction of lumbar region: Secondary | ICD-10-CM | POA: Diagnosis not present

## 2021-01-28 DIAGNOSIS — M9904 Segmental and somatic dysfunction of sacral region: Secondary | ICD-10-CM | POA: Diagnosis not present

## 2021-01-28 DIAGNOSIS — M9905 Segmental and somatic dysfunction of pelvic region: Secondary | ICD-10-CM | POA: Diagnosis not present

## 2021-02-04 DIAGNOSIS — M9904 Segmental and somatic dysfunction of sacral region: Secondary | ICD-10-CM | POA: Diagnosis not present

## 2021-02-04 DIAGNOSIS — M9903 Segmental and somatic dysfunction of lumbar region: Secondary | ICD-10-CM | POA: Diagnosis not present

## 2021-02-04 DIAGNOSIS — M5136 Other intervertebral disc degeneration, lumbar region: Secondary | ICD-10-CM | POA: Diagnosis not present

## 2021-02-04 DIAGNOSIS — M9905 Segmental and somatic dysfunction of pelvic region: Secondary | ICD-10-CM | POA: Diagnosis not present

## 2021-02-11 DIAGNOSIS — M9905 Segmental and somatic dysfunction of pelvic region: Secondary | ICD-10-CM | POA: Diagnosis not present

## 2021-02-11 DIAGNOSIS — M9904 Segmental and somatic dysfunction of sacral region: Secondary | ICD-10-CM | POA: Diagnosis not present

## 2021-02-11 DIAGNOSIS — M5136 Other intervertebral disc degeneration, lumbar region: Secondary | ICD-10-CM | POA: Diagnosis not present

## 2021-02-11 DIAGNOSIS — M9903 Segmental and somatic dysfunction of lumbar region: Secondary | ICD-10-CM | POA: Diagnosis not present

## 2021-02-17 DIAGNOSIS — M5136 Other intervertebral disc degeneration, lumbar region: Secondary | ICD-10-CM | POA: Diagnosis not present

## 2021-02-17 DIAGNOSIS — M9904 Segmental and somatic dysfunction of sacral region: Secondary | ICD-10-CM | POA: Diagnosis not present

## 2021-02-17 DIAGNOSIS — M9905 Segmental and somatic dysfunction of pelvic region: Secondary | ICD-10-CM | POA: Diagnosis not present

## 2021-02-17 DIAGNOSIS — M9903 Segmental and somatic dysfunction of lumbar region: Secondary | ICD-10-CM | POA: Diagnosis not present

## 2021-03-04 DIAGNOSIS — M9903 Segmental and somatic dysfunction of lumbar region: Secondary | ICD-10-CM | POA: Diagnosis not present

## 2021-03-04 DIAGNOSIS — M5136 Other intervertebral disc degeneration, lumbar region: Secondary | ICD-10-CM | POA: Diagnosis not present

## 2021-03-04 DIAGNOSIS — M9904 Segmental and somatic dysfunction of sacral region: Secondary | ICD-10-CM | POA: Diagnosis not present

## 2021-03-04 DIAGNOSIS — M9905 Segmental and somatic dysfunction of pelvic region: Secondary | ICD-10-CM | POA: Diagnosis not present

## 2021-03-11 DIAGNOSIS — M9903 Segmental and somatic dysfunction of lumbar region: Secondary | ICD-10-CM | POA: Diagnosis not present

## 2021-03-11 DIAGNOSIS — M5136 Other intervertebral disc degeneration, lumbar region: Secondary | ICD-10-CM | POA: Diagnosis not present

## 2021-03-11 DIAGNOSIS — M9905 Segmental and somatic dysfunction of pelvic region: Secondary | ICD-10-CM | POA: Diagnosis not present

## 2021-03-11 DIAGNOSIS — M9904 Segmental and somatic dysfunction of sacral region: Secondary | ICD-10-CM | POA: Diagnosis not present

## 2021-03-18 DIAGNOSIS — M5136 Other intervertebral disc degeneration, lumbar region: Secondary | ICD-10-CM | POA: Diagnosis not present

## 2021-03-18 DIAGNOSIS — M9903 Segmental and somatic dysfunction of lumbar region: Secondary | ICD-10-CM | POA: Diagnosis not present

## 2021-03-18 DIAGNOSIS — M9905 Segmental and somatic dysfunction of pelvic region: Secondary | ICD-10-CM | POA: Diagnosis not present

## 2021-03-18 DIAGNOSIS — M9904 Segmental and somatic dysfunction of sacral region: Secondary | ICD-10-CM | POA: Diagnosis not present

## 2021-03-23 DIAGNOSIS — Z0001 Encounter for general adult medical examination with abnormal findings: Secondary | ICD-10-CM | POA: Diagnosis not present

## 2021-03-23 DIAGNOSIS — Z79899 Other long term (current) drug therapy: Secondary | ICD-10-CM | POA: Diagnosis not present

## 2021-03-23 DIAGNOSIS — E039 Hypothyroidism, unspecified: Secondary | ICD-10-CM | POA: Diagnosis not present

## 2021-03-23 DIAGNOSIS — E7800 Pure hypercholesterolemia, unspecified: Secondary | ICD-10-CM | POA: Diagnosis not present

## 2021-03-23 DIAGNOSIS — E1169 Type 2 diabetes mellitus with other specified complication: Secondary | ICD-10-CM | POA: Diagnosis not present

## 2021-03-23 DIAGNOSIS — E113292 Type 2 diabetes mellitus with mild nonproliferative diabetic retinopathy without macular edema, left eye: Secondary | ICD-10-CM | POA: Diagnosis not present

## 2021-03-29 IMAGING — DX DG FOOT COMPLETE 3+V*R*
3 series · 3 of 3 positions shown · non-contrast
Comparison: None.

CLINICAL DATA: Right foot pain along the lateral side for 2 weeks
that began during sleep.

EXAM:
RIGHT FOOT COMPLETE - 3+ VIEW

[foot ap]
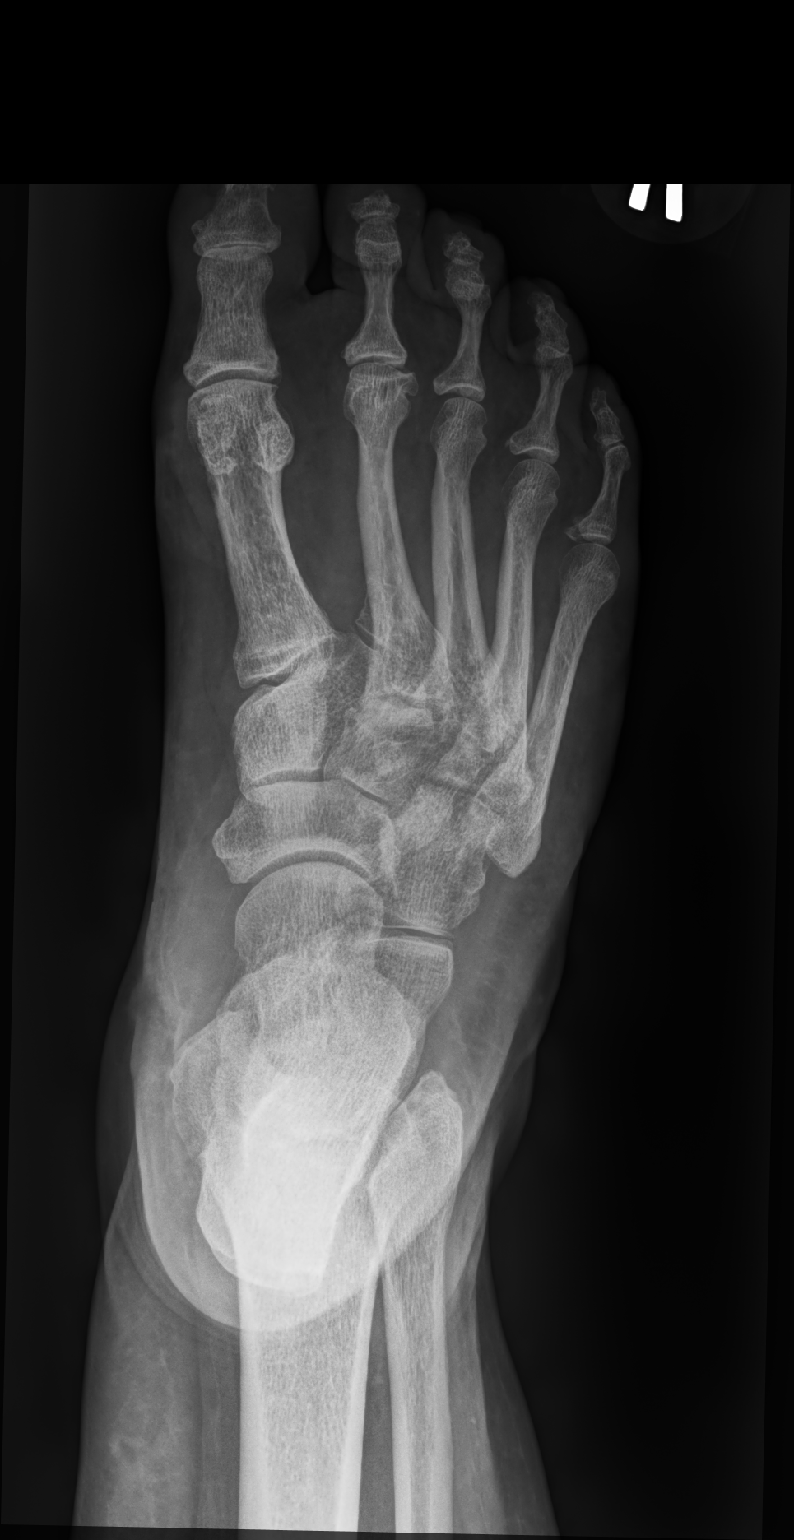

[foot mlo]
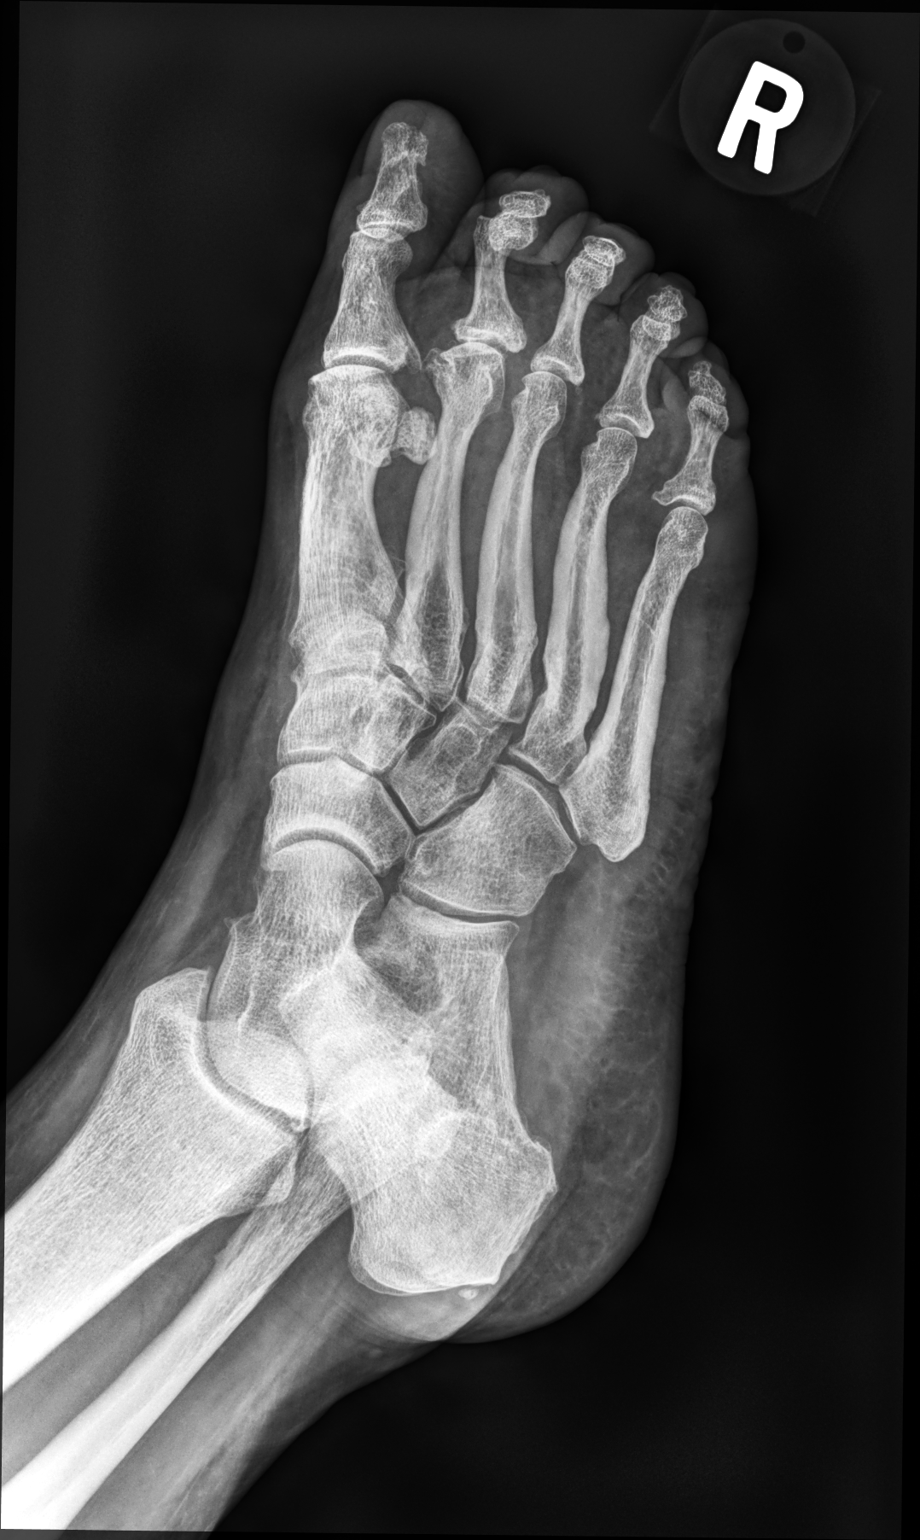

[foot lat]
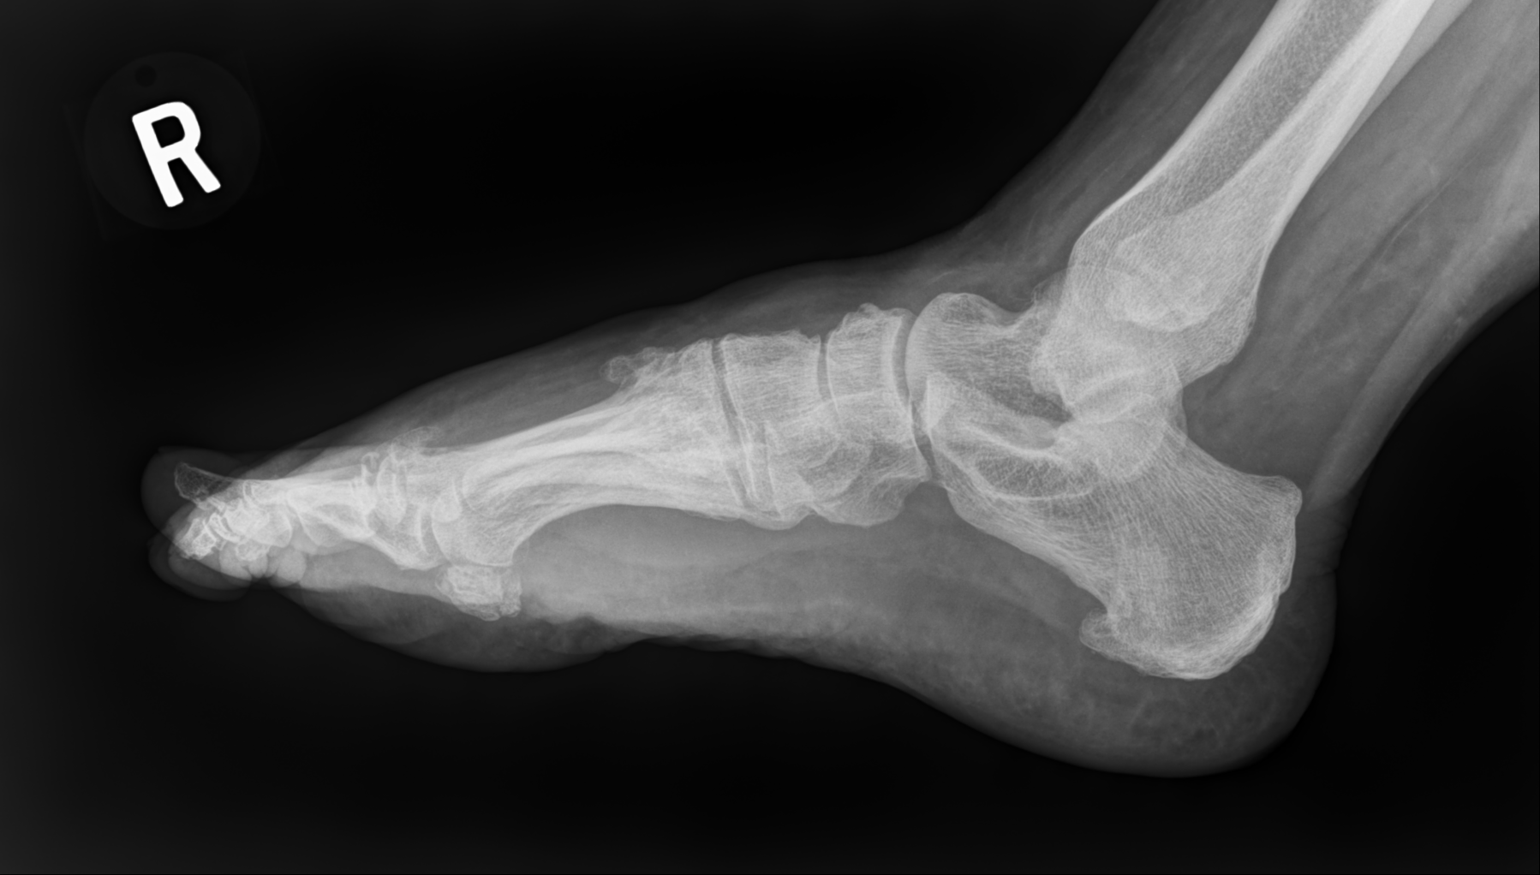

[3 of 3 positions shown; findings below may reference images not displayed]

FINDINGS: No acute fracture or dislocation. No convincing stress
reaction-cortical thickening of the second through fourth digits
appears chronic and smooth. Multifocal degenerative spurring
especially at the TMT joints dorsally and at the first, second, and
fifth MTP joints. Generalized osteopenic appearance. Non eroded heel
spur.
IMPRESSION: 1. No acute finding.
2. Multifocal degenerative spurring.

## 2021-04-08 DIAGNOSIS — M9903 Segmental and somatic dysfunction of lumbar region: Secondary | ICD-10-CM | POA: Diagnosis not present

## 2021-04-08 DIAGNOSIS — M9905 Segmental and somatic dysfunction of pelvic region: Secondary | ICD-10-CM | POA: Diagnosis not present

## 2021-04-08 DIAGNOSIS — M5136 Other intervertebral disc degeneration, lumbar region: Secondary | ICD-10-CM | POA: Diagnosis not present

## 2021-04-08 DIAGNOSIS — M9904 Segmental and somatic dysfunction of sacral region: Secondary | ICD-10-CM | POA: Diagnosis not present

## 2021-04-15 DIAGNOSIS — M9903 Segmental and somatic dysfunction of lumbar region: Secondary | ICD-10-CM | POA: Diagnosis not present

## 2021-04-15 DIAGNOSIS — M9904 Segmental and somatic dysfunction of sacral region: Secondary | ICD-10-CM | POA: Diagnosis not present

## 2021-04-15 DIAGNOSIS — M5136 Other intervertebral disc degeneration, lumbar region: Secondary | ICD-10-CM | POA: Diagnosis not present

## 2021-04-15 DIAGNOSIS — M9905 Segmental and somatic dysfunction of pelvic region: Secondary | ICD-10-CM | POA: Diagnosis not present

## 2021-04-20 DIAGNOSIS — M5136 Other intervertebral disc degeneration, lumbar region: Secondary | ICD-10-CM | POA: Diagnosis not present

## 2021-04-20 DIAGNOSIS — M9904 Segmental and somatic dysfunction of sacral region: Secondary | ICD-10-CM | POA: Diagnosis not present

## 2021-04-20 DIAGNOSIS — M9903 Segmental and somatic dysfunction of lumbar region: Secondary | ICD-10-CM | POA: Diagnosis not present

## 2021-04-20 DIAGNOSIS — M9905 Segmental and somatic dysfunction of pelvic region: Secondary | ICD-10-CM | POA: Diagnosis not present

## 2021-04-22 DIAGNOSIS — M5136 Other intervertebral disc degeneration, lumbar region: Secondary | ICD-10-CM | POA: Diagnosis not present

## 2021-04-22 DIAGNOSIS — M5134 Other intervertebral disc degeneration, thoracic region: Secondary | ICD-10-CM | POA: Diagnosis not present

## 2021-04-22 DIAGNOSIS — M9902 Segmental and somatic dysfunction of thoracic region: Secondary | ICD-10-CM | POA: Diagnosis not present

## 2021-04-22 DIAGNOSIS — M9903 Segmental and somatic dysfunction of lumbar region: Secondary | ICD-10-CM | POA: Diagnosis not present

## 2021-04-29 DIAGNOSIS — M9903 Segmental and somatic dysfunction of lumbar region: Secondary | ICD-10-CM | POA: Diagnosis not present

## 2021-04-29 DIAGNOSIS — M5134 Other intervertebral disc degeneration, thoracic region: Secondary | ICD-10-CM | POA: Diagnosis not present

## 2021-04-29 DIAGNOSIS — M5136 Other intervertebral disc degeneration, lumbar region: Secondary | ICD-10-CM | POA: Diagnosis not present

## 2021-04-29 DIAGNOSIS — M9902 Segmental and somatic dysfunction of thoracic region: Secondary | ICD-10-CM | POA: Diagnosis not present

## 2021-05-06 DIAGNOSIS — M9903 Segmental and somatic dysfunction of lumbar region: Secondary | ICD-10-CM | POA: Diagnosis not present

## 2021-05-06 DIAGNOSIS — M5136 Other intervertebral disc degeneration, lumbar region: Secondary | ICD-10-CM | POA: Diagnosis not present

## 2021-05-06 DIAGNOSIS — M9902 Segmental and somatic dysfunction of thoracic region: Secondary | ICD-10-CM | POA: Diagnosis not present

## 2021-05-06 DIAGNOSIS — M5134 Other intervertebral disc degeneration, thoracic region: Secondary | ICD-10-CM | POA: Diagnosis not present

## 2021-05-13 DIAGNOSIS — M9903 Segmental and somatic dysfunction of lumbar region: Secondary | ICD-10-CM | POA: Diagnosis not present

## 2021-05-13 DIAGNOSIS — M5134 Other intervertebral disc degeneration, thoracic region: Secondary | ICD-10-CM | POA: Diagnosis not present

## 2021-05-13 DIAGNOSIS — M9902 Segmental and somatic dysfunction of thoracic region: Secondary | ICD-10-CM | POA: Diagnosis not present

## 2021-05-13 DIAGNOSIS — M5136 Other intervertebral disc degeneration, lumbar region: Secondary | ICD-10-CM | POA: Diagnosis not present

## 2021-05-20 DIAGNOSIS — M5136 Other intervertebral disc degeneration, lumbar region: Secondary | ICD-10-CM | POA: Diagnosis not present

## 2021-05-20 DIAGNOSIS — M9903 Segmental and somatic dysfunction of lumbar region: Secondary | ICD-10-CM | POA: Diagnosis not present

## 2021-05-20 DIAGNOSIS — M5134 Other intervertebral disc degeneration, thoracic region: Secondary | ICD-10-CM | POA: Diagnosis not present

## 2021-05-20 DIAGNOSIS — M9902 Segmental and somatic dysfunction of thoracic region: Secondary | ICD-10-CM | POA: Diagnosis not present

## 2021-05-21 DIAGNOSIS — M9903 Segmental and somatic dysfunction of lumbar region: Secondary | ICD-10-CM | POA: Diagnosis not present

## 2021-05-21 DIAGNOSIS — M5136 Other intervertebral disc degeneration, lumbar region: Secondary | ICD-10-CM | POA: Diagnosis not present

## 2021-05-21 DIAGNOSIS — M5134 Other intervertebral disc degeneration, thoracic region: Secondary | ICD-10-CM | POA: Diagnosis not present

## 2021-05-21 DIAGNOSIS — M9902 Segmental and somatic dysfunction of thoracic region: Secondary | ICD-10-CM | POA: Diagnosis not present

## 2021-05-27 DIAGNOSIS — M5134 Other intervertebral disc degeneration, thoracic region: Secondary | ICD-10-CM | POA: Diagnosis not present

## 2021-05-27 DIAGNOSIS — M9903 Segmental and somatic dysfunction of lumbar region: Secondary | ICD-10-CM | POA: Diagnosis not present

## 2021-05-27 DIAGNOSIS — M5136 Other intervertebral disc degeneration, lumbar region: Secondary | ICD-10-CM | POA: Diagnosis not present

## 2021-05-27 DIAGNOSIS — M9902 Segmental and somatic dysfunction of thoracic region: Secondary | ICD-10-CM | POA: Diagnosis not present

## 2021-06-03 DIAGNOSIS — M5136 Other intervertebral disc degeneration, lumbar region: Secondary | ICD-10-CM | POA: Diagnosis not present

## 2021-06-03 DIAGNOSIS — M9903 Segmental and somatic dysfunction of lumbar region: Secondary | ICD-10-CM | POA: Diagnosis not present

## 2021-06-03 DIAGNOSIS — M5134 Other intervertebral disc degeneration, thoracic region: Secondary | ICD-10-CM | POA: Diagnosis not present

## 2021-06-03 DIAGNOSIS — M9902 Segmental and somatic dysfunction of thoracic region: Secondary | ICD-10-CM | POA: Diagnosis not present

## 2021-06-10 DIAGNOSIS — M5136 Other intervertebral disc degeneration, lumbar region: Secondary | ICD-10-CM | POA: Diagnosis not present

## 2021-06-10 DIAGNOSIS — M5134 Other intervertebral disc degeneration, thoracic region: Secondary | ICD-10-CM | POA: Diagnosis not present

## 2021-06-10 DIAGNOSIS — M9903 Segmental and somatic dysfunction of lumbar region: Secondary | ICD-10-CM | POA: Diagnosis not present

## 2021-06-10 DIAGNOSIS — M9902 Segmental and somatic dysfunction of thoracic region: Secondary | ICD-10-CM | POA: Diagnosis not present

## 2021-06-17 DIAGNOSIS — M5134 Other intervertebral disc degeneration, thoracic region: Secondary | ICD-10-CM | POA: Diagnosis not present

## 2021-06-17 DIAGNOSIS — M5136 Other intervertebral disc degeneration, lumbar region: Secondary | ICD-10-CM | POA: Diagnosis not present

## 2021-06-17 DIAGNOSIS — M9903 Segmental and somatic dysfunction of lumbar region: Secondary | ICD-10-CM | POA: Diagnosis not present

## 2021-06-17 DIAGNOSIS — M9902 Segmental and somatic dysfunction of thoracic region: Secondary | ICD-10-CM | POA: Diagnosis not present

## 2021-06-24 DIAGNOSIS — M9903 Segmental and somatic dysfunction of lumbar region: Secondary | ICD-10-CM | POA: Diagnosis not present

## 2021-06-24 DIAGNOSIS — M9902 Segmental and somatic dysfunction of thoracic region: Secondary | ICD-10-CM | POA: Diagnosis not present

## 2021-06-24 DIAGNOSIS — M5136 Other intervertebral disc degeneration, lumbar region: Secondary | ICD-10-CM | POA: Diagnosis not present

## 2021-06-24 DIAGNOSIS — M5134 Other intervertebral disc degeneration, thoracic region: Secondary | ICD-10-CM | POA: Diagnosis not present

## 2021-07-01 DIAGNOSIS — M9902 Segmental and somatic dysfunction of thoracic region: Secondary | ICD-10-CM | POA: Diagnosis not present

## 2021-07-01 DIAGNOSIS — M5134 Other intervertebral disc degeneration, thoracic region: Secondary | ICD-10-CM | POA: Diagnosis not present

## 2021-07-01 DIAGNOSIS — M5136 Other intervertebral disc degeneration, lumbar region: Secondary | ICD-10-CM | POA: Diagnosis not present

## 2021-07-01 DIAGNOSIS — M9903 Segmental and somatic dysfunction of lumbar region: Secondary | ICD-10-CM | POA: Diagnosis not present

## 2021-07-08 DIAGNOSIS — M9902 Segmental and somatic dysfunction of thoracic region: Secondary | ICD-10-CM | POA: Diagnosis not present

## 2021-07-08 DIAGNOSIS — M5134 Other intervertebral disc degeneration, thoracic region: Secondary | ICD-10-CM | POA: Diagnosis not present

## 2021-07-08 DIAGNOSIS — M9903 Segmental and somatic dysfunction of lumbar region: Secondary | ICD-10-CM | POA: Diagnosis not present

## 2021-07-08 DIAGNOSIS — M5136 Other intervertebral disc degeneration, lumbar region: Secondary | ICD-10-CM | POA: Diagnosis not present

## 2021-07-15 DIAGNOSIS — M9903 Segmental and somatic dysfunction of lumbar region: Secondary | ICD-10-CM | POA: Diagnosis not present

## 2021-07-15 DIAGNOSIS — M5136 Other intervertebral disc degeneration, lumbar region: Secondary | ICD-10-CM | POA: Diagnosis not present

## 2021-07-15 DIAGNOSIS — M9902 Segmental and somatic dysfunction of thoracic region: Secondary | ICD-10-CM | POA: Diagnosis not present

## 2021-07-15 DIAGNOSIS — M5134 Other intervertebral disc degeneration, thoracic region: Secondary | ICD-10-CM | POA: Diagnosis not present

## 2021-07-22 DIAGNOSIS — M9902 Segmental and somatic dysfunction of thoracic region: Secondary | ICD-10-CM | POA: Diagnosis not present

## 2021-07-22 DIAGNOSIS — M9903 Segmental and somatic dysfunction of lumbar region: Secondary | ICD-10-CM | POA: Diagnosis not present

## 2021-07-22 DIAGNOSIS — M5136 Other intervertebral disc degeneration, lumbar region: Secondary | ICD-10-CM | POA: Diagnosis not present

## 2021-07-22 DIAGNOSIS — M5134 Other intervertebral disc degeneration, thoracic region: Secondary | ICD-10-CM | POA: Diagnosis not present

## 2021-07-23 IMAGING — MG DIGITAL SCREENING BILAT W/ CAD
4 series · 4 of 4 positions shown · non-contrast
Comparison: Previous exam(s).

CLINICAL DATA: Screening.

EXAM:
DIGITAL SCREENING BILATERAL MAMMOGRAM WITH CAD

[L CC]
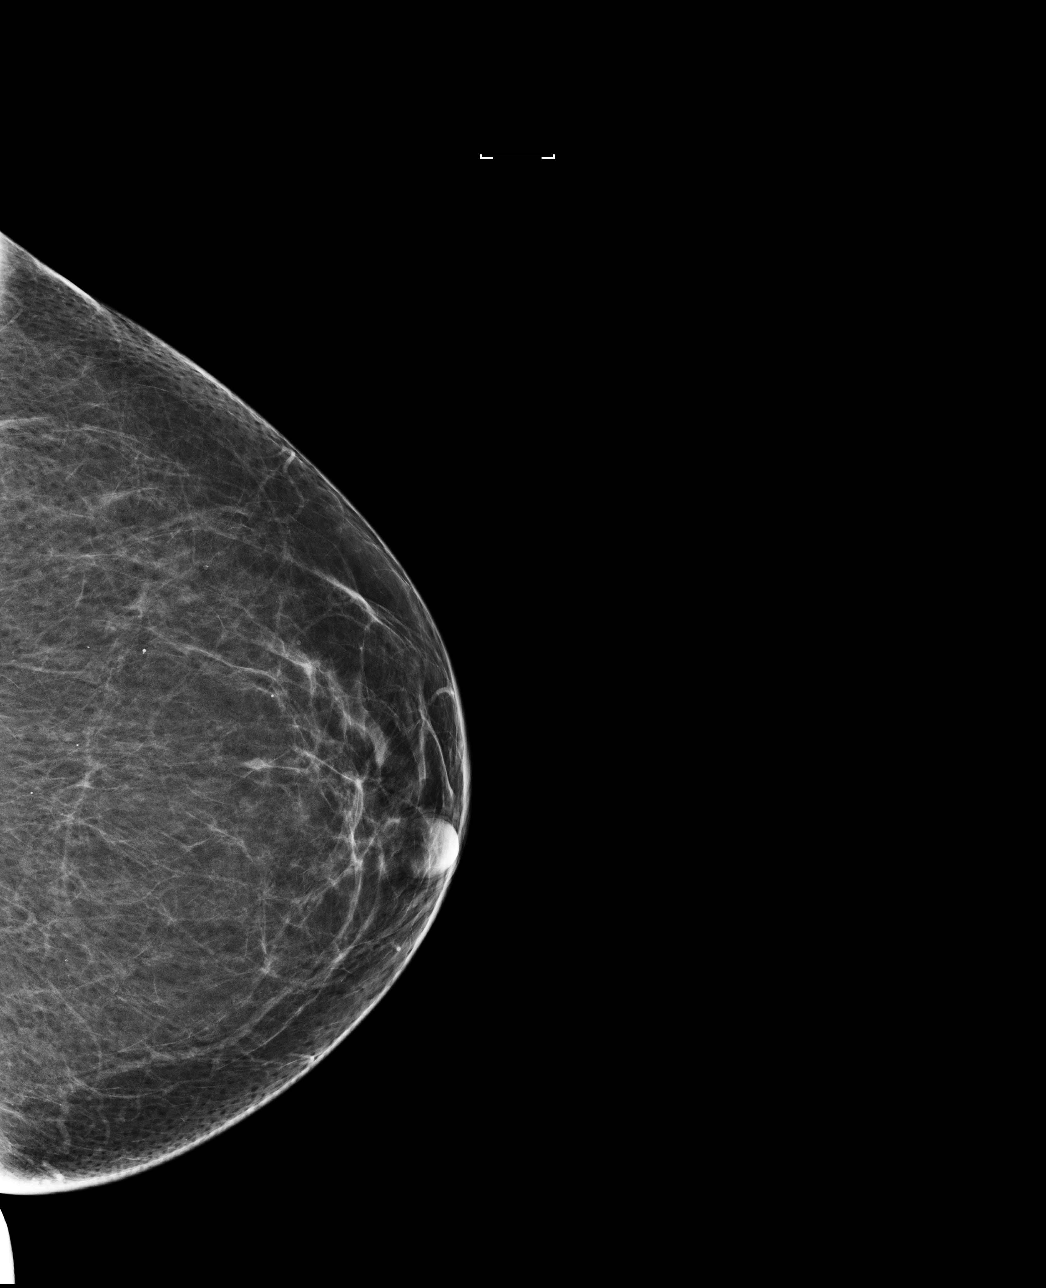

[R MLO]
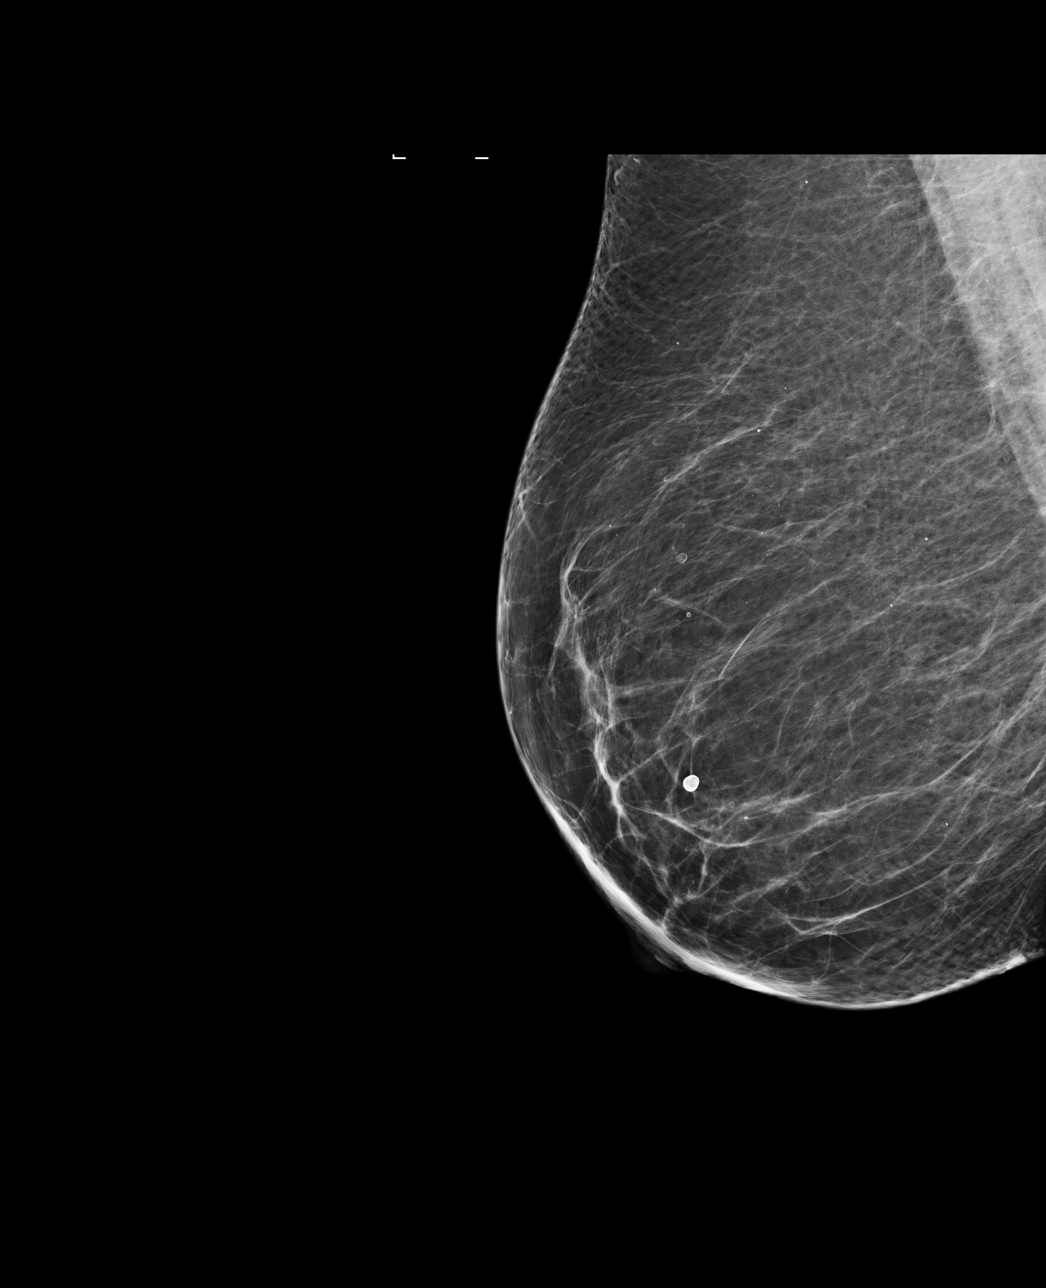

[L MLO]
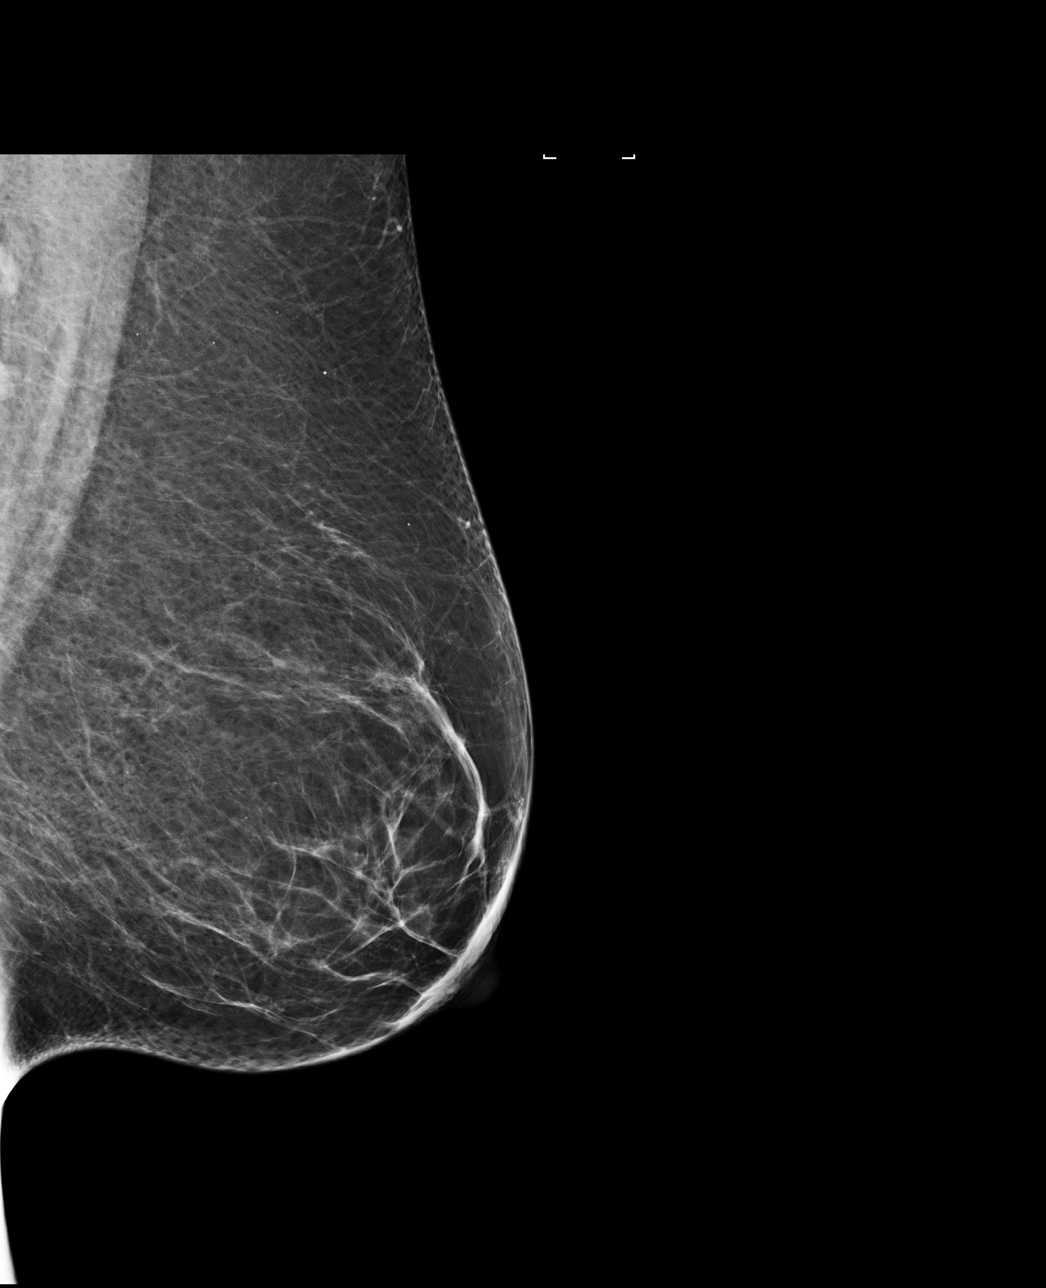

[R CC]
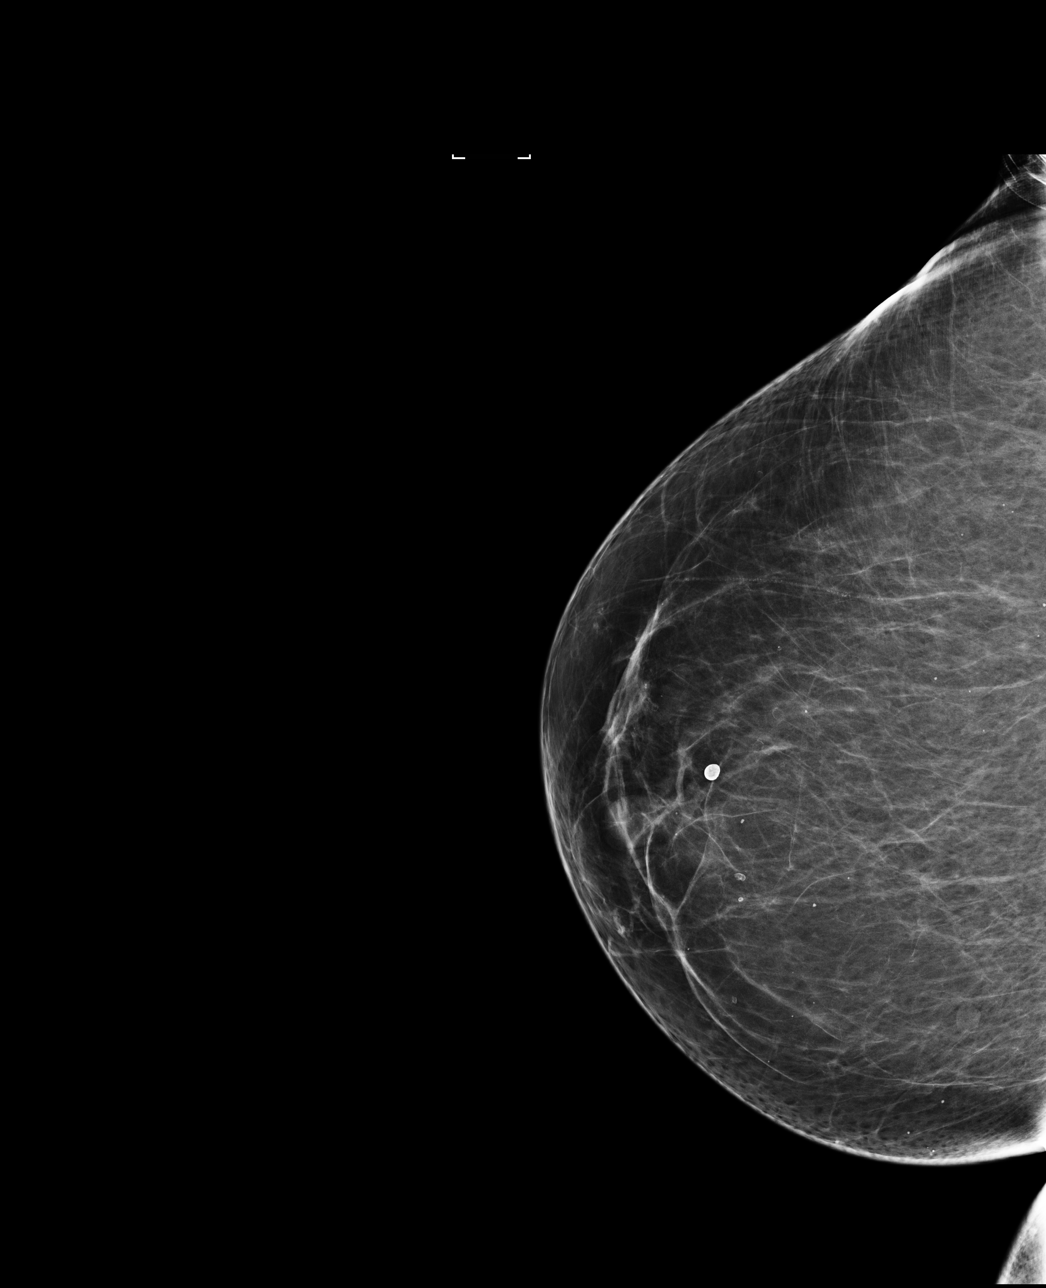

[4 of 4 positions shown; findings below may reference images not displayed]

ACR Breast Density Category b: There are scattered areas of
fibroglandular density.
FINDINGS: There are no findings suspicious for malignancy. Images were
processed with CAD.
IMPRESSION: No mammographic evidence of malignancy. A result letter of this
screening mammogram will be mailed directly to the patient.

RECOMMENDATION:
Screening mammogram in one year. (Code:AS-G-LCT)

BI-RADS CATEGORY  1: Negative.

## 2021-07-28 DIAGNOSIS — Z23 Encounter for immunization: Secondary | ICD-10-CM | POA: Diagnosis not present

## 2021-07-29 DIAGNOSIS — M5136 Other intervertebral disc degeneration, lumbar region: Secondary | ICD-10-CM | POA: Diagnosis not present

## 2021-07-29 DIAGNOSIS — M5134 Other intervertebral disc degeneration, thoracic region: Secondary | ICD-10-CM | POA: Diagnosis not present

## 2021-07-29 DIAGNOSIS — M9902 Segmental and somatic dysfunction of thoracic region: Secondary | ICD-10-CM | POA: Diagnosis not present

## 2021-07-29 DIAGNOSIS — M9903 Segmental and somatic dysfunction of lumbar region: Secondary | ICD-10-CM | POA: Diagnosis not present

## 2021-08-05 DIAGNOSIS — M5136 Other intervertebral disc degeneration, lumbar region: Secondary | ICD-10-CM | POA: Diagnosis not present

## 2021-08-05 DIAGNOSIS — M9902 Segmental and somatic dysfunction of thoracic region: Secondary | ICD-10-CM | POA: Diagnosis not present

## 2021-08-05 DIAGNOSIS — M5134 Other intervertebral disc degeneration, thoracic region: Secondary | ICD-10-CM | POA: Diagnosis not present

## 2021-08-05 DIAGNOSIS — M9903 Segmental and somatic dysfunction of lumbar region: Secondary | ICD-10-CM | POA: Diagnosis not present

## 2021-08-12 DIAGNOSIS — M9903 Segmental and somatic dysfunction of lumbar region: Secondary | ICD-10-CM | POA: Diagnosis not present

## 2021-08-12 DIAGNOSIS — M5134 Other intervertebral disc degeneration, thoracic region: Secondary | ICD-10-CM | POA: Diagnosis not present

## 2021-08-12 DIAGNOSIS — M9902 Segmental and somatic dysfunction of thoracic region: Secondary | ICD-10-CM | POA: Diagnosis not present

## 2021-08-12 DIAGNOSIS — M5136 Other intervertebral disc degeneration, lumbar region: Secondary | ICD-10-CM | POA: Diagnosis not present

## 2021-08-19 DIAGNOSIS — M9903 Segmental and somatic dysfunction of lumbar region: Secondary | ICD-10-CM | POA: Diagnosis not present

## 2021-08-19 DIAGNOSIS — M5136 Other intervertebral disc degeneration, lumbar region: Secondary | ICD-10-CM | POA: Diagnosis not present

## 2021-08-19 DIAGNOSIS — M9902 Segmental and somatic dysfunction of thoracic region: Secondary | ICD-10-CM | POA: Diagnosis not present

## 2021-08-19 DIAGNOSIS — M5134 Other intervertebral disc degeneration, thoracic region: Secondary | ICD-10-CM | POA: Diagnosis not present

## 2021-08-26 DIAGNOSIS — M9903 Segmental and somatic dysfunction of lumbar region: Secondary | ICD-10-CM | POA: Diagnosis not present

## 2021-08-26 DIAGNOSIS — M9902 Segmental and somatic dysfunction of thoracic region: Secondary | ICD-10-CM | POA: Diagnosis not present

## 2021-08-26 DIAGNOSIS — M5134 Other intervertebral disc degeneration, thoracic region: Secondary | ICD-10-CM | POA: Diagnosis not present

## 2021-08-26 DIAGNOSIS — M5136 Other intervertebral disc degeneration, lumbar region: Secondary | ICD-10-CM | POA: Diagnosis not present

## 2021-09-02 DIAGNOSIS — M9903 Segmental and somatic dysfunction of lumbar region: Secondary | ICD-10-CM | POA: Diagnosis not present

## 2021-09-02 DIAGNOSIS — M9904 Segmental and somatic dysfunction of sacral region: Secondary | ICD-10-CM | POA: Diagnosis not present

## 2021-09-02 DIAGNOSIS — M9905 Segmental and somatic dysfunction of pelvic region: Secondary | ICD-10-CM | POA: Diagnosis not present

## 2021-09-02 DIAGNOSIS — M5136 Other intervertebral disc degeneration, lumbar region: Secondary | ICD-10-CM | POA: Diagnosis not present

## 2021-09-16 DIAGNOSIS — M9903 Segmental and somatic dysfunction of lumbar region: Secondary | ICD-10-CM | POA: Diagnosis not present

## 2021-09-16 DIAGNOSIS — M5136 Other intervertebral disc degeneration, lumbar region: Secondary | ICD-10-CM | POA: Diagnosis not present

## 2021-09-16 DIAGNOSIS — Z20822 Contact with and (suspected) exposure to covid-19: Secondary | ICD-10-CM | POA: Diagnosis not present

## 2021-09-16 DIAGNOSIS — M9904 Segmental and somatic dysfunction of sacral region: Secondary | ICD-10-CM | POA: Diagnosis not present

## 2021-09-16 DIAGNOSIS — M9905 Segmental and somatic dysfunction of pelvic region: Secondary | ICD-10-CM | POA: Diagnosis not present

## 2021-09-18 DIAGNOSIS — J988 Other specified respiratory disorders: Secondary | ICD-10-CM | POA: Diagnosis not present

## 2021-09-18 DIAGNOSIS — R051 Acute cough: Secondary | ICD-10-CM | POA: Diagnosis not present

## 2021-09-23 DIAGNOSIS — M5136 Other intervertebral disc degeneration, lumbar region: Secondary | ICD-10-CM | POA: Diagnosis not present

## 2021-09-23 DIAGNOSIS — M9903 Segmental and somatic dysfunction of lumbar region: Secondary | ICD-10-CM | POA: Diagnosis not present

## 2021-09-23 DIAGNOSIS — M9905 Segmental and somatic dysfunction of pelvic region: Secondary | ICD-10-CM | POA: Diagnosis not present

## 2021-09-23 DIAGNOSIS — M9904 Segmental and somatic dysfunction of sacral region: Secondary | ICD-10-CM | POA: Diagnosis not present

## 2021-09-28 DIAGNOSIS — Z7984 Long term (current) use of oral hypoglycemic drugs: Secondary | ICD-10-CM | POA: Diagnosis not present

## 2021-09-28 DIAGNOSIS — I10 Essential (primary) hypertension: Secondary | ICD-10-CM | POA: Diagnosis not present

## 2021-09-28 DIAGNOSIS — E039 Hypothyroidism, unspecified: Secondary | ICD-10-CM | POA: Diagnosis not present

## 2021-09-28 DIAGNOSIS — E876 Hypokalemia: Secondary | ICD-10-CM | POA: Diagnosis not present

## 2021-09-28 DIAGNOSIS — I700 Atherosclerosis of aorta: Secondary | ICD-10-CM | POA: Diagnosis not present

## 2021-09-28 DIAGNOSIS — E1169 Type 2 diabetes mellitus with other specified complication: Secondary | ICD-10-CM | POA: Diagnosis not present

## 2021-09-28 DIAGNOSIS — E7800 Pure hypercholesterolemia, unspecified: Secondary | ICD-10-CM | POA: Diagnosis not present

## 2021-09-28 DIAGNOSIS — G8929 Other chronic pain: Secondary | ICD-10-CM | POA: Diagnosis not present

## 2021-09-30 DIAGNOSIS — M5136 Other intervertebral disc degeneration, lumbar region: Secondary | ICD-10-CM | POA: Diagnosis not present

## 2021-09-30 DIAGNOSIS — M9903 Segmental and somatic dysfunction of lumbar region: Secondary | ICD-10-CM | POA: Diagnosis not present

## 2021-09-30 DIAGNOSIS — M9905 Segmental and somatic dysfunction of pelvic region: Secondary | ICD-10-CM | POA: Diagnosis not present

## 2021-09-30 DIAGNOSIS — M9904 Segmental and somatic dysfunction of sacral region: Secondary | ICD-10-CM | POA: Diagnosis not present

## 2021-10-01 DIAGNOSIS — Z20822 Contact with and (suspected) exposure to covid-19: Secondary | ICD-10-CM | POA: Diagnosis not present

## 2021-10-07 DIAGNOSIS — M9905 Segmental and somatic dysfunction of pelvic region: Secondary | ICD-10-CM | POA: Diagnosis not present

## 2021-10-07 DIAGNOSIS — M9903 Segmental and somatic dysfunction of lumbar region: Secondary | ICD-10-CM | POA: Diagnosis not present

## 2021-10-07 DIAGNOSIS — M5136 Other intervertebral disc degeneration, lumbar region: Secondary | ICD-10-CM | POA: Diagnosis not present

## 2021-10-07 DIAGNOSIS — M9904 Segmental and somatic dysfunction of sacral region: Secondary | ICD-10-CM | POA: Diagnosis not present

## 2021-10-14 DIAGNOSIS — M9903 Segmental and somatic dysfunction of lumbar region: Secondary | ICD-10-CM | POA: Diagnosis not present

## 2021-10-14 DIAGNOSIS — M9904 Segmental and somatic dysfunction of sacral region: Secondary | ICD-10-CM | POA: Diagnosis not present

## 2021-10-14 DIAGNOSIS — M5136 Other intervertebral disc degeneration, lumbar region: Secondary | ICD-10-CM | POA: Diagnosis not present

## 2021-10-14 DIAGNOSIS — M9905 Segmental and somatic dysfunction of pelvic region: Secondary | ICD-10-CM | POA: Diagnosis not present

## 2021-10-21 DIAGNOSIS — M9904 Segmental and somatic dysfunction of sacral region: Secondary | ICD-10-CM | POA: Diagnosis not present

## 2021-10-21 DIAGNOSIS — M9905 Segmental and somatic dysfunction of pelvic region: Secondary | ICD-10-CM | POA: Diagnosis not present

## 2021-10-21 DIAGNOSIS — M5136 Other intervertebral disc degeneration, lumbar region: Secondary | ICD-10-CM | POA: Diagnosis not present

## 2021-10-21 DIAGNOSIS — M9903 Segmental and somatic dysfunction of lumbar region: Secondary | ICD-10-CM | POA: Diagnosis not present

## 2021-10-27 DIAGNOSIS — M9904 Segmental and somatic dysfunction of sacral region: Secondary | ICD-10-CM | POA: Diagnosis not present

## 2021-10-27 DIAGNOSIS — M9903 Segmental and somatic dysfunction of lumbar region: Secondary | ICD-10-CM | POA: Diagnosis not present

## 2021-10-27 DIAGNOSIS — M5136 Other intervertebral disc degeneration, lumbar region: Secondary | ICD-10-CM | POA: Diagnosis not present

## 2021-10-27 DIAGNOSIS — M9905 Segmental and somatic dysfunction of pelvic region: Secondary | ICD-10-CM | POA: Diagnosis not present

## 2021-11-10 DIAGNOSIS — M5136 Other intervertebral disc degeneration, lumbar region: Secondary | ICD-10-CM | POA: Diagnosis not present

## 2021-11-10 DIAGNOSIS — M9903 Segmental and somatic dysfunction of lumbar region: Secondary | ICD-10-CM | POA: Diagnosis not present

## 2021-11-10 DIAGNOSIS — M9905 Segmental and somatic dysfunction of pelvic region: Secondary | ICD-10-CM | POA: Diagnosis not present

## 2021-11-10 DIAGNOSIS — M9904 Segmental and somatic dysfunction of sacral region: Secondary | ICD-10-CM | POA: Diagnosis not present

## 2021-11-18 DIAGNOSIS — M5136 Other intervertebral disc degeneration, lumbar region: Secondary | ICD-10-CM | POA: Diagnosis not present

## 2021-11-18 DIAGNOSIS — M9905 Segmental and somatic dysfunction of pelvic region: Secondary | ICD-10-CM | POA: Diagnosis not present

## 2021-11-18 DIAGNOSIS — M9904 Segmental and somatic dysfunction of sacral region: Secondary | ICD-10-CM | POA: Diagnosis not present

## 2021-11-18 DIAGNOSIS — M9903 Segmental and somatic dysfunction of lumbar region: Secondary | ICD-10-CM | POA: Diagnosis not present

## 2021-11-25 DIAGNOSIS — M9903 Segmental and somatic dysfunction of lumbar region: Secondary | ICD-10-CM | POA: Diagnosis not present

## 2021-11-25 DIAGNOSIS — M9904 Segmental and somatic dysfunction of sacral region: Secondary | ICD-10-CM | POA: Diagnosis not present

## 2021-11-25 DIAGNOSIS — M5136 Other intervertebral disc degeneration, lumbar region: Secondary | ICD-10-CM | POA: Diagnosis not present

## 2021-11-25 DIAGNOSIS — M9905 Segmental and somatic dysfunction of pelvic region: Secondary | ICD-10-CM | POA: Diagnosis not present

## 2021-12-02 DIAGNOSIS — M9903 Segmental and somatic dysfunction of lumbar region: Secondary | ICD-10-CM | POA: Diagnosis not present

## 2021-12-02 DIAGNOSIS — M9904 Segmental and somatic dysfunction of sacral region: Secondary | ICD-10-CM | POA: Diagnosis not present

## 2021-12-02 DIAGNOSIS — M9905 Segmental and somatic dysfunction of pelvic region: Secondary | ICD-10-CM | POA: Diagnosis not present

## 2021-12-02 DIAGNOSIS — M5136 Other intervertebral disc degeneration, lumbar region: Secondary | ICD-10-CM | POA: Diagnosis not present

## 2021-12-09 ENCOUNTER — Other Ambulatory Visit: Payer: Self-pay | Admitting: Family Medicine

## 2021-12-09 DIAGNOSIS — Z1231 Encounter for screening mammogram for malignant neoplasm of breast: Secondary | ICD-10-CM

## 2021-12-09 DIAGNOSIS — M9904 Segmental and somatic dysfunction of sacral region: Secondary | ICD-10-CM | POA: Diagnosis not present

## 2021-12-09 DIAGNOSIS — M9903 Segmental and somatic dysfunction of lumbar region: Secondary | ICD-10-CM | POA: Diagnosis not present

## 2021-12-09 DIAGNOSIS — M5136 Other intervertebral disc degeneration, lumbar region: Secondary | ICD-10-CM | POA: Diagnosis not present

## 2021-12-09 DIAGNOSIS — M9905 Segmental and somatic dysfunction of pelvic region: Secondary | ICD-10-CM | POA: Diagnosis not present

## 2021-12-16 DIAGNOSIS — M9903 Segmental and somatic dysfunction of lumbar region: Secondary | ICD-10-CM | POA: Diagnosis not present

## 2021-12-16 DIAGNOSIS — M5136 Other intervertebral disc degeneration, lumbar region: Secondary | ICD-10-CM | POA: Diagnosis not present

## 2021-12-16 DIAGNOSIS — M9904 Segmental and somatic dysfunction of sacral region: Secondary | ICD-10-CM | POA: Diagnosis not present

## 2021-12-16 DIAGNOSIS — M9905 Segmental and somatic dysfunction of pelvic region: Secondary | ICD-10-CM | POA: Diagnosis not present

## 2021-12-23 DIAGNOSIS — M9904 Segmental and somatic dysfunction of sacral region: Secondary | ICD-10-CM | POA: Diagnosis not present

## 2021-12-23 DIAGNOSIS — M9905 Segmental and somatic dysfunction of pelvic region: Secondary | ICD-10-CM | POA: Diagnosis not present

## 2021-12-23 DIAGNOSIS — M5136 Other intervertebral disc degeneration, lumbar region: Secondary | ICD-10-CM | POA: Diagnosis not present

## 2021-12-23 DIAGNOSIS — M9903 Segmental and somatic dysfunction of lumbar region: Secondary | ICD-10-CM | POA: Diagnosis not present

## 2021-12-30 DIAGNOSIS — M9903 Segmental and somatic dysfunction of lumbar region: Secondary | ICD-10-CM | POA: Diagnosis not present

## 2021-12-30 DIAGNOSIS — M9904 Segmental and somatic dysfunction of sacral region: Secondary | ICD-10-CM | POA: Diagnosis not present

## 2021-12-30 DIAGNOSIS — M9905 Segmental and somatic dysfunction of pelvic region: Secondary | ICD-10-CM | POA: Diagnosis not present

## 2021-12-30 DIAGNOSIS — M5136 Other intervertebral disc degeneration, lumbar region: Secondary | ICD-10-CM | POA: Diagnosis not present

## 2022-01-06 DIAGNOSIS — M9904 Segmental and somatic dysfunction of sacral region: Secondary | ICD-10-CM | POA: Diagnosis not present

## 2022-01-06 DIAGNOSIS — M9903 Segmental and somatic dysfunction of lumbar region: Secondary | ICD-10-CM | POA: Diagnosis not present

## 2022-01-06 DIAGNOSIS — M9905 Segmental and somatic dysfunction of pelvic region: Secondary | ICD-10-CM | POA: Diagnosis not present

## 2022-01-06 DIAGNOSIS — M5136 Other intervertebral disc degeneration, lumbar region: Secondary | ICD-10-CM | POA: Diagnosis not present

## 2022-01-13 DIAGNOSIS — M9905 Segmental and somatic dysfunction of pelvic region: Secondary | ICD-10-CM | POA: Diagnosis not present

## 2022-01-13 DIAGNOSIS — M5136 Other intervertebral disc degeneration, lumbar region: Secondary | ICD-10-CM | POA: Diagnosis not present

## 2022-01-13 DIAGNOSIS — M9903 Segmental and somatic dysfunction of lumbar region: Secondary | ICD-10-CM | POA: Diagnosis not present

## 2022-01-13 DIAGNOSIS — M9904 Segmental and somatic dysfunction of sacral region: Secondary | ICD-10-CM | POA: Diagnosis not present

## 2022-01-20 DIAGNOSIS — M9905 Segmental and somatic dysfunction of pelvic region: Secondary | ICD-10-CM | POA: Diagnosis not present

## 2022-01-20 DIAGNOSIS — M9904 Segmental and somatic dysfunction of sacral region: Secondary | ICD-10-CM | POA: Diagnosis not present

## 2022-01-20 DIAGNOSIS — M9903 Segmental and somatic dysfunction of lumbar region: Secondary | ICD-10-CM | POA: Diagnosis not present

## 2022-01-20 DIAGNOSIS — M5136 Other intervertebral disc degeneration, lumbar region: Secondary | ICD-10-CM | POA: Diagnosis not present

## 2022-01-21 ENCOUNTER — Ambulatory Visit
Admission: RE | Admit: 2022-01-21 | Discharge: 2022-01-21 | Disposition: A | Discharge status: Home / Self Care | Payer: HMO | Source: Ambulatory Visit | Attending: Family Medicine | Admitting: Family Medicine

## 2022-01-21 DIAGNOSIS — Z1231 Encounter for screening mammogram for malignant neoplasm of breast: Secondary | ICD-10-CM | POA: Diagnosis not present

## 2022-01-25 DIAGNOSIS — E113293 Type 2 diabetes mellitus with mild nonproliferative diabetic retinopathy without macular edema, bilateral: Secondary | ICD-10-CM | POA: Diagnosis not present

## 2022-01-27 DIAGNOSIS — M9905 Segmental and somatic dysfunction of pelvic region: Secondary | ICD-10-CM | POA: Diagnosis not present

## 2022-01-27 DIAGNOSIS — M5136 Other intervertebral disc degeneration, lumbar region: Secondary | ICD-10-CM | POA: Diagnosis not present

## 2022-01-27 DIAGNOSIS — M9904 Segmental and somatic dysfunction of sacral region: Secondary | ICD-10-CM | POA: Diagnosis not present

## 2022-01-27 DIAGNOSIS — M9903 Segmental and somatic dysfunction of lumbar region: Secondary | ICD-10-CM | POA: Diagnosis not present

## 2022-02-02 DIAGNOSIS — M9903 Segmental and somatic dysfunction of lumbar region: Secondary | ICD-10-CM | POA: Diagnosis not present

## 2022-02-02 DIAGNOSIS — M9904 Segmental and somatic dysfunction of sacral region: Secondary | ICD-10-CM | POA: Diagnosis not present

## 2022-02-02 DIAGNOSIS — M9905 Segmental and somatic dysfunction of pelvic region: Secondary | ICD-10-CM | POA: Diagnosis not present

## 2022-02-02 DIAGNOSIS — M5136 Other intervertebral disc degeneration, lumbar region: Secondary | ICD-10-CM | POA: Diagnosis not present

## 2022-02-03 DIAGNOSIS — M9903 Segmental and somatic dysfunction of lumbar region: Secondary | ICD-10-CM | POA: Diagnosis not present

## 2022-02-03 DIAGNOSIS — M9905 Segmental and somatic dysfunction of pelvic region: Secondary | ICD-10-CM | POA: Diagnosis not present

## 2022-02-03 DIAGNOSIS — M5136 Other intervertebral disc degeneration, lumbar region: Secondary | ICD-10-CM | POA: Diagnosis not present

## 2022-02-03 DIAGNOSIS — M9904 Segmental and somatic dysfunction of sacral region: Secondary | ICD-10-CM | POA: Diagnosis not present

## 2022-02-10 DIAGNOSIS — M5136 Other intervertebral disc degeneration, lumbar region: Secondary | ICD-10-CM | POA: Diagnosis not present

## 2022-02-10 DIAGNOSIS — M9904 Segmental and somatic dysfunction of sacral region: Secondary | ICD-10-CM | POA: Diagnosis not present

## 2022-02-10 DIAGNOSIS — M9905 Segmental and somatic dysfunction of pelvic region: Secondary | ICD-10-CM | POA: Diagnosis not present

## 2022-02-10 DIAGNOSIS — M9903 Segmental and somatic dysfunction of lumbar region: Secondary | ICD-10-CM | POA: Diagnosis not present

## 2022-02-17 DIAGNOSIS — M9905 Segmental and somatic dysfunction of pelvic region: Secondary | ICD-10-CM | POA: Diagnosis not present

## 2022-02-17 DIAGNOSIS — M9903 Segmental and somatic dysfunction of lumbar region: Secondary | ICD-10-CM | POA: Diagnosis not present

## 2022-02-17 DIAGNOSIS — M5136 Other intervertebral disc degeneration, lumbar region: Secondary | ICD-10-CM | POA: Diagnosis not present

## 2022-02-17 DIAGNOSIS — M9904 Segmental and somatic dysfunction of sacral region: Secondary | ICD-10-CM | POA: Diagnosis not present

## 2022-02-25 DIAGNOSIS — M9903 Segmental and somatic dysfunction of lumbar region: Secondary | ICD-10-CM | POA: Diagnosis not present

## 2022-02-25 DIAGNOSIS — M9905 Segmental and somatic dysfunction of pelvic region: Secondary | ICD-10-CM | POA: Diagnosis not present

## 2022-02-25 DIAGNOSIS — M9904 Segmental and somatic dysfunction of sacral region: Secondary | ICD-10-CM | POA: Diagnosis not present

## 2022-02-25 DIAGNOSIS — M5136 Other intervertebral disc degeneration, lumbar region: Secondary | ICD-10-CM | POA: Diagnosis not present

## 2022-03-03 DIAGNOSIS — M9905 Segmental and somatic dysfunction of pelvic region: Secondary | ICD-10-CM | POA: Diagnosis not present

## 2022-03-03 DIAGNOSIS — M9903 Segmental and somatic dysfunction of lumbar region: Secondary | ICD-10-CM | POA: Diagnosis not present

## 2022-03-03 DIAGNOSIS — M9904 Segmental and somatic dysfunction of sacral region: Secondary | ICD-10-CM | POA: Diagnosis not present

## 2022-03-03 DIAGNOSIS — M5136 Other intervertebral disc degeneration, lumbar region: Secondary | ICD-10-CM | POA: Diagnosis not present

## 2022-03-10 DIAGNOSIS — M9904 Segmental and somatic dysfunction of sacral region: Secondary | ICD-10-CM | POA: Diagnosis not present

## 2022-03-10 DIAGNOSIS — M5136 Other intervertebral disc degeneration, lumbar region: Secondary | ICD-10-CM | POA: Diagnosis not present

## 2022-03-10 DIAGNOSIS — M9903 Segmental and somatic dysfunction of lumbar region: Secondary | ICD-10-CM | POA: Diagnosis not present

## 2022-03-10 DIAGNOSIS — M9905 Segmental and somatic dysfunction of pelvic region: Secondary | ICD-10-CM | POA: Diagnosis not present

## 2022-03-17 DIAGNOSIS — M9903 Segmental and somatic dysfunction of lumbar region: Secondary | ICD-10-CM | POA: Diagnosis not present

## 2022-03-17 DIAGNOSIS — M9905 Segmental and somatic dysfunction of pelvic region: Secondary | ICD-10-CM | POA: Diagnosis not present

## 2022-03-17 DIAGNOSIS — M5136 Other intervertebral disc degeneration, lumbar region: Secondary | ICD-10-CM | POA: Diagnosis not present

## 2022-03-17 DIAGNOSIS — M9904 Segmental and somatic dysfunction of sacral region: Secondary | ICD-10-CM | POA: Diagnosis not present

## 2022-03-24 DIAGNOSIS — M9904 Segmental and somatic dysfunction of sacral region: Secondary | ICD-10-CM | POA: Diagnosis not present

## 2022-03-24 DIAGNOSIS — M5136 Other intervertebral disc degeneration, lumbar region: Secondary | ICD-10-CM | POA: Diagnosis not present

## 2022-03-24 DIAGNOSIS — M9905 Segmental and somatic dysfunction of pelvic region: Secondary | ICD-10-CM | POA: Diagnosis not present

## 2022-03-24 DIAGNOSIS — M9903 Segmental and somatic dysfunction of lumbar region: Secondary | ICD-10-CM | POA: Diagnosis not present

## 2022-03-31 DIAGNOSIS — M9905 Segmental and somatic dysfunction of pelvic region: Secondary | ICD-10-CM | POA: Diagnosis not present

## 2022-03-31 DIAGNOSIS — M5136 Other intervertebral disc degeneration, lumbar region: Secondary | ICD-10-CM | POA: Diagnosis not present

## 2022-03-31 DIAGNOSIS — M9904 Segmental and somatic dysfunction of sacral region: Secondary | ICD-10-CM | POA: Diagnosis not present

## 2022-03-31 DIAGNOSIS — M9903 Segmental and somatic dysfunction of lumbar region: Secondary | ICD-10-CM | POA: Diagnosis not present

## 2022-04-02 DIAGNOSIS — I700 Atherosclerosis of aorta: Secondary | ICD-10-CM | POA: Diagnosis not present

## 2022-04-02 DIAGNOSIS — E039 Hypothyroidism, unspecified: Secondary | ICD-10-CM | POA: Diagnosis not present

## 2022-04-02 DIAGNOSIS — E113292 Type 2 diabetes mellitus with mild nonproliferative diabetic retinopathy without macular edema, left eye: Secondary | ICD-10-CM | POA: Diagnosis not present

## 2022-04-02 DIAGNOSIS — E1169 Type 2 diabetes mellitus with other specified complication: Secondary | ICD-10-CM | POA: Diagnosis not present

## 2022-04-02 DIAGNOSIS — E876 Hypokalemia: Secondary | ICD-10-CM | POA: Diagnosis not present

## 2022-04-02 DIAGNOSIS — K219 Gastro-esophageal reflux disease without esophagitis: Secondary | ICD-10-CM | POA: Diagnosis not present

## 2022-04-02 DIAGNOSIS — Z0000 Encounter for general adult medical examination without abnormal findings: Secondary | ICD-10-CM | POA: Diagnosis not present

## 2022-04-02 DIAGNOSIS — I10 Essential (primary) hypertension: Secondary | ICD-10-CM | POA: Diagnosis not present

## 2022-04-02 DIAGNOSIS — R0982 Postnasal drip: Secondary | ICD-10-CM | POA: Diagnosis not present

## 2022-04-02 DIAGNOSIS — E7800 Pure hypercholesterolemia, unspecified: Secondary | ICD-10-CM | POA: Diagnosis not present

## 2022-04-07 DIAGNOSIS — M9905 Segmental and somatic dysfunction of pelvic region: Secondary | ICD-10-CM | POA: Diagnosis not present

## 2022-04-07 DIAGNOSIS — M9903 Segmental and somatic dysfunction of lumbar region: Secondary | ICD-10-CM | POA: Diagnosis not present

## 2022-04-07 DIAGNOSIS — M5136 Other intervertebral disc degeneration, lumbar region: Secondary | ICD-10-CM | POA: Diagnosis not present

## 2022-04-07 DIAGNOSIS — M9904 Segmental and somatic dysfunction of sacral region: Secondary | ICD-10-CM | POA: Diagnosis not present

## 2022-04-15 DIAGNOSIS — M9905 Segmental and somatic dysfunction of pelvic region: Secondary | ICD-10-CM | POA: Diagnosis not present

## 2022-04-15 DIAGNOSIS — M9904 Segmental and somatic dysfunction of sacral region: Secondary | ICD-10-CM | POA: Diagnosis not present

## 2022-04-15 DIAGNOSIS — M5136 Other intervertebral disc degeneration, lumbar region: Secondary | ICD-10-CM | POA: Diagnosis not present

## 2022-04-15 DIAGNOSIS — M9903 Segmental and somatic dysfunction of lumbar region: Secondary | ICD-10-CM | POA: Diagnosis not present

## 2022-04-22 DIAGNOSIS — M9905 Segmental and somatic dysfunction of pelvic region: Secondary | ICD-10-CM | POA: Diagnosis not present

## 2022-04-22 DIAGNOSIS — M9903 Segmental and somatic dysfunction of lumbar region: Secondary | ICD-10-CM | POA: Diagnosis not present

## 2022-04-22 DIAGNOSIS — M5136 Other intervertebral disc degeneration, lumbar region: Secondary | ICD-10-CM | POA: Diagnosis not present

## 2022-04-22 DIAGNOSIS — M9904 Segmental and somatic dysfunction of sacral region: Secondary | ICD-10-CM | POA: Diagnosis not present

## 2022-04-28 DIAGNOSIS — M9903 Segmental and somatic dysfunction of lumbar region: Secondary | ICD-10-CM | POA: Diagnosis not present

## 2022-04-28 DIAGNOSIS — M9904 Segmental and somatic dysfunction of sacral region: Secondary | ICD-10-CM | POA: Diagnosis not present

## 2022-04-28 DIAGNOSIS — M5136 Other intervertebral disc degeneration, lumbar region: Secondary | ICD-10-CM | POA: Diagnosis not present

## 2022-04-28 DIAGNOSIS — M9905 Segmental and somatic dysfunction of pelvic region: Secondary | ICD-10-CM | POA: Diagnosis not present

## 2022-05-05 DIAGNOSIS — M5136 Other intervertebral disc degeneration, lumbar region: Secondary | ICD-10-CM | POA: Diagnosis not present

## 2022-05-05 DIAGNOSIS — M9904 Segmental and somatic dysfunction of sacral region: Secondary | ICD-10-CM | POA: Diagnosis not present

## 2022-05-05 DIAGNOSIS — M9905 Segmental and somatic dysfunction of pelvic region: Secondary | ICD-10-CM | POA: Diagnosis not present

## 2022-05-05 DIAGNOSIS — M9903 Segmental and somatic dysfunction of lumbar region: Secondary | ICD-10-CM | POA: Diagnosis not present

## 2022-05-12 DIAGNOSIS — M9903 Segmental and somatic dysfunction of lumbar region: Secondary | ICD-10-CM | POA: Diagnosis not present

## 2022-05-12 DIAGNOSIS — M9905 Segmental and somatic dysfunction of pelvic region: Secondary | ICD-10-CM | POA: Diagnosis not present

## 2022-05-12 DIAGNOSIS — M9904 Segmental and somatic dysfunction of sacral region: Secondary | ICD-10-CM | POA: Diagnosis not present

## 2022-05-12 DIAGNOSIS — M5136 Other intervertebral disc degeneration, lumbar region: Secondary | ICD-10-CM | POA: Diagnosis not present

## 2022-05-19 DIAGNOSIS — M9904 Segmental and somatic dysfunction of sacral region: Secondary | ICD-10-CM | POA: Diagnosis not present

## 2022-05-19 DIAGNOSIS — M9903 Segmental and somatic dysfunction of lumbar region: Secondary | ICD-10-CM | POA: Diagnosis not present

## 2022-05-19 DIAGNOSIS — M9905 Segmental and somatic dysfunction of pelvic region: Secondary | ICD-10-CM | POA: Diagnosis not present

## 2022-05-19 DIAGNOSIS — M5136 Other intervertebral disc degeneration, lumbar region: Secondary | ICD-10-CM | POA: Diagnosis not present

## 2022-05-26 DIAGNOSIS — M5136 Other intervertebral disc degeneration, lumbar region: Secondary | ICD-10-CM | POA: Diagnosis not present

## 2022-05-26 DIAGNOSIS — M9904 Segmental and somatic dysfunction of sacral region: Secondary | ICD-10-CM | POA: Diagnosis not present

## 2022-05-26 DIAGNOSIS — M9903 Segmental and somatic dysfunction of lumbar region: Secondary | ICD-10-CM | POA: Diagnosis not present

## 2022-05-26 DIAGNOSIS — M9905 Segmental and somatic dysfunction of pelvic region: Secondary | ICD-10-CM | POA: Diagnosis not present

## 2022-06-02 DIAGNOSIS — M5136 Other intervertebral disc degeneration, lumbar region: Secondary | ICD-10-CM | POA: Diagnosis not present

## 2022-06-02 DIAGNOSIS — M9903 Segmental and somatic dysfunction of lumbar region: Secondary | ICD-10-CM | POA: Diagnosis not present

## 2022-06-02 DIAGNOSIS — M9905 Segmental and somatic dysfunction of pelvic region: Secondary | ICD-10-CM | POA: Diagnosis not present

## 2022-06-02 DIAGNOSIS — M9904 Segmental and somatic dysfunction of sacral region: Secondary | ICD-10-CM | POA: Diagnosis not present

## 2022-06-09 DIAGNOSIS — M5136 Other intervertebral disc degeneration, lumbar region: Secondary | ICD-10-CM | POA: Diagnosis not present

## 2022-06-09 DIAGNOSIS — M9903 Segmental and somatic dysfunction of lumbar region: Secondary | ICD-10-CM | POA: Diagnosis not present

## 2022-06-09 DIAGNOSIS — M9904 Segmental and somatic dysfunction of sacral region: Secondary | ICD-10-CM | POA: Diagnosis not present

## 2022-06-09 DIAGNOSIS — M9905 Segmental and somatic dysfunction of pelvic region: Secondary | ICD-10-CM | POA: Diagnosis not present

## 2022-06-16 DIAGNOSIS — M9905 Segmental and somatic dysfunction of pelvic region: Secondary | ICD-10-CM | POA: Diagnosis not present

## 2022-06-16 DIAGNOSIS — M9903 Segmental and somatic dysfunction of lumbar region: Secondary | ICD-10-CM | POA: Diagnosis not present

## 2022-06-16 DIAGNOSIS — M5136 Other intervertebral disc degeneration, lumbar region: Secondary | ICD-10-CM | POA: Diagnosis not present

## 2022-06-16 DIAGNOSIS — M9904 Segmental and somatic dysfunction of sacral region: Secondary | ICD-10-CM | POA: Diagnosis not present

## 2022-06-23 DIAGNOSIS — M9905 Segmental and somatic dysfunction of pelvic region: Secondary | ICD-10-CM | POA: Diagnosis not present

## 2022-06-23 DIAGNOSIS — M9904 Segmental and somatic dysfunction of sacral region: Secondary | ICD-10-CM | POA: Diagnosis not present

## 2022-06-23 DIAGNOSIS — M9903 Segmental and somatic dysfunction of lumbar region: Secondary | ICD-10-CM | POA: Diagnosis not present

## 2022-06-23 DIAGNOSIS — M5136 Other intervertebral disc degeneration, lumbar region: Secondary | ICD-10-CM | POA: Diagnosis not present

## 2022-06-30 DIAGNOSIS — M9903 Segmental and somatic dysfunction of lumbar region: Secondary | ICD-10-CM | POA: Diagnosis not present

## 2022-06-30 DIAGNOSIS — M5136 Other intervertebral disc degeneration, lumbar region: Secondary | ICD-10-CM | POA: Diagnosis not present

## 2022-06-30 DIAGNOSIS — M9905 Segmental and somatic dysfunction of pelvic region: Secondary | ICD-10-CM | POA: Diagnosis not present

## 2022-06-30 DIAGNOSIS — M9904 Segmental and somatic dysfunction of sacral region: Secondary | ICD-10-CM | POA: Diagnosis not present

## 2022-07-07 DIAGNOSIS — M9903 Segmental and somatic dysfunction of lumbar region: Secondary | ICD-10-CM | POA: Diagnosis not present

## 2022-07-07 DIAGNOSIS — M5136 Other intervertebral disc degeneration, lumbar region: Secondary | ICD-10-CM | POA: Diagnosis not present

## 2022-07-07 DIAGNOSIS — M9905 Segmental and somatic dysfunction of pelvic region: Secondary | ICD-10-CM | POA: Diagnosis not present

## 2022-07-07 DIAGNOSIS — M9904 Segmental and somatic dysfunction of sacral region: Secondary | ICD-10-CM | POA: Diagnosis not present

## 2022-07-14 DIAGNOSIS — M9904 Segmental and somatic dysfunction of sacral region: Secondary | ICD-10-CM | POA: Diagnosis not present

## 2022-07-14 DIAGNOSIS — M9905 Segmental and somatic dysfunction of pelvic region: Secondary | ICD-10-CM | POA: Diagnosis not present

## 2022-07-14 DIAGNOSIS — M9903 Segmental and somatic dysfunction of lumbar region: Secondary | ICD-10-CM | POA: Diagnosis not present

## 2022-07-14 DIAGNOSIS — M5136 Other intervertebral disc degeneration, lumbar region: Secondary | ICD-10-CM | POA: Diagnosis not present

## 2022-07-21 DIAGNOSIS — M9905 Segmental and somatic dysfunction of pelvic region: Secondary | ICD-10-CM | POA: Diagnosis not present

## 2022-07-21 DIAGNOSIS — M9903 Segmental and somatic dysfunction of lumbar region: Secondary | ICD-10-CM | POA: Diagnosis not present

## 2022-07-21 DIAGNOSIS — M9904 Segmental and somatic dysfunction of sacral region: Secondary | ICD-10-CM | POA: Diagnosis not present

## 2022-07-21 DIAGNOSIS — M5136 Other intervertebral disc degeneration, lumbar region: Secondary | ICD-10-CM | POA: Diagnosis not present

## 2022-07-28 DIAGNOSIS — M5136 Other intervertebral disc degeneration, lumbar region: Secondary | ICD-10-CM | POA: Diagnosis not present

## 2022-07-28 DIAGNOSIS — M9904 Segmental and somatic dysfunction of sacral region: Secondary | ICD-10-CM | POA: Diagnosis not present

## 2022-07-28 DIAGNOSIS — M9905 Segmental and somatic dysfunction of pelvic region: Secondary | ICD-10-CM | POA: Diagnosis not present

## 2022-07-28 DIAGNOSIS — M9903 Segmental and somatic dysfunction of lumbar region: Secondary | ICD-10-CM | POA: Diagnosis not present

## 2022-08-02 DIAGNOSIS — M9905 Segmental and somatic dysfunction of pelvic region: Secondary | ICD-10-CM | POA: Diagnosis not present

## 2022-08-02 DIAGNOSIS — M5136 Other intervertebral disc degeneration, lumbar region: Secondary | ICD-10-CM | POA: Diagnosis not present

## 2022-08-02 DIAGNOSIS — M9904 Segmental and somatic dysfunction of sacral region: Secondary | ICD-10-CM | POA: Diagnosis not present

## 2022-08-02 DIAGNOSIS — M9903 Segmental and somatic dysfunction of lumbar region: Secondary | ICD-10-CM | POA: Diagnosis not present

## 2022-09-29 ENCOUNTER — Other Ambulatory Visit (HOSPITAL_COMMUNITY): Payer: Self-pay | Admitting: Family Medicine

## 2022-09-29 DIAGNOSIS — R0989 Other specified symptoms and signs involving the circulatory and respiratory systems: Secondary | ICD-10-CM

## 2022-10-11 ENCOUNTER — Ambulatory Visit (HOSPITAL_COMMUNITY)
Admission: RE | Admit: 2022-10-11 | Discharge: 2022-10-11 | Disposition: A | Discharge status: Home / Self Care | Payer: HMO | Source: Ambulatory Visit | Attending: Family Medicine | Admitting: Family Medicine

## 2022-10-11 ENCOUNTER — Encounter: Payer: Self-pay | Admitting: Radiology

## 2022-10-11 DIAGNOSIS — R0989 Other specified symptoms and signs involving the circulatory and respiratory systems: Secondary | ICD-10-CM | POA: Insufficient documentation

## 2022-11-03 DIAGNOSIS — M5136 Other intervertebral disc degeneration, lumbar region: Secondary | ICD-10-CM | POA: Diagnosis not present

## 2022-11-03 DIAGNOSIS — M9904 Segmental and somatic dysfunction of sacral region: Secondary | ICD-10-CM | POA: Diagnosis not present

## 2022-11-03 DIAGNOSIS — M9905 Segmental and somatic dysfunction of pelvic region: Secondary | ICD-10-CM | POA: Diagnosis not present

## 2022-11-03 DIAGNOSIS — M9903 Segmental and somatic dysfunction of lumbar region: Secondary | ICD-10-CM | POA: Diagnosis not present

## 2022-11-10 DIAGNOSIS — M9905 Segmental and somatic dysfunction of pelvic region: Secondary | ICD-10-CM | POA: Diagnosis not present

## 2022-11-10 DIAGNOSIS — M9903 Segmental and somatic dysfunction of lumbar region: Secondary | ICD-10-CM | POA: Diagnosis not present

## 2022-11-10 DIAGNOSIS — M5136 Other intervertebral disc degeneration, lumbar region: Secondary | ICD-10-CM | POA: Diagnosis not present

## 2022-11-10 DIAGNOSIS — M9904 Segmental and somatic dysfunction of sacral region: Secondary | ICD-10-CM | POA: Diagnosis not present

## 2022-11-17 DIAGNOSIS — M5136 Other intervertebral disc degeneration, lumbar region: Secondary | ICD-10-CM | POA: Diagnosis not present

## 2022-11-17 DIAGNOSIS — M9903 Segmental and somatic dysfunction of lumbar region: Secondary | ICD-10-CM | POA: Diagnosis not present

## 2022-11-17 DIAGNOSIS — M9905 Segmental and somatic dysfunction of pelvic region: Secondary | ICD-10-CM | POA: Diagnosis not present

## 2022-11-17 DIAGNOSIS — M9904 Segmental and somatic dysfunction of sacral region: Secondary | ICD-10-CM | POA: Diagnosis not present

## 2022-11-24 DIAGNOSIS — M9903 Segmental and somatic dysfunction of lumbar region: Secondary | ICD-10-CM | POA: Diagnosis not present

## 2022-11-24 DIAGNOSIS — M5136 Other intervertebral disc degeneration, lumbar region: Secondary | ICD-10-CM | POA: Diagnosis not present

## 2022-11-24 DIAGNOSIS — M9904 Segmental and somatic dysfunction of sacral region: Secondary | ICD-10-CM | POA: Diagnosis not present

## 2022-11-24 DIAGNOSIS — M9905 Segmental and somatic dysfunction of pelvic region: Secondary | ICD-10-CM | POA: Diagnosis not present

## 2022-12-01 DIAGNOSIS — M9903 Segmental and somatic dysfunction of lumbar region: Secondary | ICD-10-CM | POA: Diagnosis not present

## 2022-12-01 DIAGNOSIS — M5136 Other intervertebral disc degeneration, lumbar region: Secondary | ICD-10-CM | POA: Diagnosis not present

## 2022-12-01 DIAGNOSIS — M9904 Segmental and somatic dysfunction of sacral region: Secondary | ICD-10-CM | POA: Diagnosis not present

## 2022-12-01 DIAGNOSIS — M9905 Segmental and somatic dysfunction of pelvic region: Secondary | ICD-10-CM | POA: Diagnosis not present

## 2022-12-08 DIAGNOSIS — M9904 Segmental and somatic dysfunction of sacral region: Secondary | ICD-10-CM | POA: Diagnosis not present

## 2022-12-08 DIAGNOSIS — M5136 Other intervertebral disc degeneration, lumbar region: Secondary | ICD-10-CM | POA: Diagnosis not present

## 2022-12-08 DIAGNOSIS — M9905 Segmental and somatic dysfunction of pelvic region: Secondary | ICD-10-CM | POA: Diagnosis not present

## 2022-12-08 DIAGNOSIS — M9903 Segmental and somatic dysfunction of lumbar region: Secondary | ICD-10-CM | POA: Diagnosis not present

## 2022-12-15 DIAGNOSIS — M9905 Segmental and somatic dysfunction of pelvic region: Secondary | ICD-10-CM | POA: Diagnosis not present

## 2022-12-15 DIAGNOSIS — M9904 Segmental and somatic dysfunction of sacral region: Secondary | ICD-10-CM | POA: Diagnosis not present

## 2022-12-15 DIAGNOSIS — M5136 Other intervertebral disc degeneration, lumbar region: Secondary | ICD-10-CM | POA: Diagnosis not present

## 2022-12-15 DIAGNOSIS — M9903 Segmental and somatic dysfunction of lumbar region: Secondary | ICD-10-CM | POA: Diagnosis not present

## 2022-12-16 ENCOUNTER — Other Ambulatory Visit: Payer: Self-pay | Admitting: Family Medicine

## 2022-12-16 DIAGNOSIS — Z1231 Encounter for screening mammogram for malignant neoplasm of breast: Secondary | ICD-10-CM

## 2022-12-21 DIAGNOSIS — M9905 Segmental and somatic dysfunction of pelvic region: Secondary | ICD-10-CM | POA: Diagnosis not present

## 2022-12-21 DIAGNOSIS — M9903 Segmental and somatic dysfunction of lumbar region: Secondary | ICD-10-CM | POA: Diagnosis not present

## 2022-12-21 DIAGNOSIS — M9904 Segmental and somatic dysfunction of sacral region: Secondary | ICD-10-CM | POA: Diagnosis not present

## 2022-12-21 DIAGNOSIS — M5136 Other intervertebral disc degeneration, lumbar region: Secondary | ICD-10-CM | POA: Diagnosis not present

## 2022-12-29 DIAGNOSIS — M9904 Segmental and somatic dysfunction of sacral region: Secondary | ICD-10-CM | POA: Diagnosis not present

## 2022-12-29 DIAGNOSIS — M5136 Other intervertebral disc degeneration, lumbar region: Secondary | ICD-10-CM | POA: Diagnosis not present

## 2022-12-29 DIAGNOSIS — M9903 Segmental and somatic dysfunction of lumbar region: Secondary | ICD-10-CM | POA: Diagnosis not present

## 2022-12-29 DIAGNOSIS — M9905 Segmental and somatic dysfunction of pelvic region: Secondary | ICD-10-CM | POA: Diagnosis not present

## 2023-01-05 DIAGNOSIS — M5136 Other intervertebral disc degeneration, lumbar region: Secondary | ICD-10-CM | POA: Diagnosis not present

## 2023-01-05 DIAGNOSIS — M9905 Segmental and somatic dysfunction of pelvic region: Secondary | ICD-10-CM | POA: Diagnosis not present

## 2023-01-05 DIAGNOSIS — M9904 Segmental and somatic dysfunction of sacral region: Secondary | ICD-10-CM | POA: Diagnosis not present

## 2023-01-05 DIAGNOSIS — M9903 Segmental and somatic dysfunction of lumbar region: Secondary | ICD-10-CM | POA: Diagnosis not present

## 2023-01-12 DIAGNOSIS — M9904 Segmental and somatic dysfunction of sacral region: Secondary | ICD-10-CM | POA: Diagnosis not present

## 2023-01-12 DIAGNOSIS — M5136 Other intervertebral disc degeneration, lumbar region: Secondary | ICD-10-CM | POA: Diagnosis not present

## 2023-01-12 DIAGNOSIS — M9905 Segmental and somatic dysfunction of pelvic region: Secondary | ICD-10-CM | POA: Diagnosis not present

## 2023-01-12 DIAGNOSIS — M9903 Segmental and somatic dysfunction of lumbar region: Secondary | ICD-10-CM | POA: Diagnosis not present

## 2023-01-19 DIAGNOSIS — M5136 Other intervertebral disc degeneration, lumbar region: Secondary | ICD-10-CM | POA: Diagnosis not present

## 2023-01-19 DIAGNOSIS — M9905 Segmental and somatic dysfunction of pelvic region: Secondary | ICD-10-CM | POA: Diagnosis not present

## 2023-01-19 DIAGNOSIS — M9904 Segmental and somatic dysfunction of sacral region: Secondary | ICD-10-CM | POA: Diagnosis not present

## 2023-01-19 DIAGNOSIS — M9903 Segmental and somatic dysfunction of lumbar region: Secondary | ICD-10-CM | POA: Diagnosis not present

## 2023-01-25 DIAGNOSIS — M9905 Segmental and somatic dysfunction of pelvic region: Secondary | ICD-10-CM | POA: Diagnosis not present

## 2023-01-25 DIAGNOSIS — M9903 Segmental and somatic dysfunction of lumbar region: Secondary | ICD-10-CM | POA: Diagnosis not present

## 2023-01-25 DIAGNOSIS — M5136 Other intervertebral disc degeneration, lumbar region: Secondary | ICD-10-CM | POA: Diagnosis not present

## 2023-01-25 DIAGNOSIS — M9904 Segmental and somatic dysfunction of sacral region: Secondary | ICD-10-CM | POA: Diagnosis not present

## 2023-01-27 DIAGNOSIS — E113293 Type 2 diabetes mellitus with mild nonproliferative diabetic retinopathy without macular edema, bilateral: Secondary | ICD-10-CM | POA: Diagnosis not present

## 2023-02-01 ENCOUNTER — Ambulatory Visit
Admission: RE | Admit: 2023-02-01 | Discharge: 2023-02-01 | Disposition: A | Discharge status: Home / Self Care | Payer: PPO | Source: Ambulatory Visit | Attending: Family Medicine | Admitting: Family Medicine

## 2023-02-01 DIAGNOSIS — Z1231 Encounter for screening mammogram for malignant neoplasm of breast: Secondary | ICD-10-CM

## 2023-02-02 DIAGNOSIS — M9903 Segmental and somatic dysfunction of lumbar region: Secondary | ICD-10-CM | POA: Diagnosis not present

## 2023-02-02 DIAGNOSIS — M9905 Segmental and somatic dysfunction of pelvic region: Secondary | ICD-10-CM | POA: Diagnosis not present

## 2023-02-02 DIAGNOSIS — M5136 Other intervertebral disc degeneration, lumbar region: Secondary | ICD-10-CM | POA: Diagnosis not present

## 2023-02-02 DIAGNOSIS — M9904 Segmental and somatic dysfunction of sacral region: Secondary | ICD-10-CM | POA: Diagnosis not present

## 2023-02-09 DIAGNOSIS — M9905 Segmental and somatic dysfunction of pelvic region: Secondary | ICD-10-CM | POA: Diagnosis not present

## 2023-02-09 DIAGNOSIS — M9903 Segmental and somatic dysfunction of lumbar region: Secondary | ICD-10-CM | POA: Diagnosis not present

## 2023-02-09 DIAGNOSIS — M5136 Other intervertebral disc degeneration, lumbar region: Secondary | ICD-10-CM | POA: Diagnosis not present

## 2023-02-09 DIAGNOSIS — M9904 Segmental and somatic dysfunction of sacral region: Secondary | ICD-10-CM | POA: Diagnosis not present

## 2023-02-16 DIAGNOSIS — M9905 Segmental and somatic dysfunction of pelvic region: Secondary | ICD-10-CM | POA: Diagnosis not present

## 2023-02-16 DIAGNOSIS — M5136 Other intervertebral disc degeneration, lumbar region: Secondary | ICD-10-CM | POA: Diagnosis not present

## 2023-02-16 DIAGNOSIS — M9904 Segmental and somatic dysfunction of sacral region: Secondary | ICD-10-CM | POA: Diagnosis not present

## 2023-02-16 DIAGNOSIS — M9903 Segmental and somatic dysfunction of lumbar region: Secondary | ICD-10-CM | POA: Diagnosis not present

## 2023-02-23 DIAGNOSIS — M9904 Segmental and somatic dysfunction of sacral region: Secondary | ICD-10-CM | POA: Diagnosis not present

## 2023-02-23 DIAGNOSIS — M9903 Segmental and somatic dysfunction of lumbar region: Secondary | ICD-10-CM | POA: Diagnosis not present

## 2023-02-23 DIAGNOSIS — M9905 Segmental and somatic dysfunction of pelvic region: Secondary | ICD-10-CM | POA: Diagnosis not present

## 2023-02-23 DIAGNOSIS — M5136 Other intervertebral disc degeneration, lumbar region: Secondary | ICD-10-CM | POA: Diagnosis not present

## 2023-03-02 DIAGNOSIS — M9905 Segmental and somatic dysfunction of pelvic region: Secondary | ICD-10-CM | POA: Diagnosis not present

## 2023-03-02 DIAGNOSIS — M9903 Segmental and somatic dysfunction of lumbar region: Secondary | ICD-10-CM | POA: Diagnosis not present

## 2023-03-02 DIAGNOSIS — M9904 Segmental and somatic dysfunction of sacral region: Secondary | ICD-10-CM | POA: Diagnosis not present

## 2023-03-02 DIAGNOSIS — M5136 Other intervertebral disc degeneration, lumbar region: Secondary | ICD-10-CM | POA: Diagnosis not present

## 2023-03-09 DIAGNOSIS — M9904 Segmental and somatic dysfunction of sacral region: Secondary | ICD-10-CM | POA: Diagnosis not present

## 2023-03-09 DIAGNOSIS — M9905 Segmental and somatic dysfunction of pelvic region: Secondary | ICD-10-CM | POA: Diagnosis not present

## 2023-03-09 DIAGNOSIS — M5136 Other intervertebral disc degeneration, lumbar region: Secondary | ICD-10-CM | POA: Diagnosis not present

## 2023-03-09 DIAGNOSIS — M9903 Segmental and somatic dysfunction of lumbar region: Secondary | ICD-10-CM | POA: Diagnosis not present

## 2023-03-16 DIAGNOSIS — M9903 Segmental and somatic dysfunction of lumbar region: Secondary | ICD-10-CM | POA: Diagnosis not present

## 2023-03-16 DIAGNOSIS — M5136 Other intervertebral disc degeneration, lumbar region: Secondary | ICD-10-CM | POA: Diagnosis not present

## 2023-03-16 DIAGNOSIS — M9904 Segmental and somatic dysfunction of sacral region: Secondary | ICD-10-CM | POA: Diagnosis not present

## 2023-03-16 DIAGNOSIS — M9905 Segmental and somatic dysfunction of pelvic region: Secondary | ICD-10-CM | POA: Diagnosis not present

## 2023-03-23 DIAGNOSIS — M9904 Segmental and somatic dysfunction of sacral region: Secondary | ICD-10-CM | POA: Diagnosis not present

## 2023-03-23 DIAGNOSIS — M9905 Segmental and somatic dysfunction of pelvic region: Secondary | ICD-10-CM | POA: Diagnosis not present

## 2023-03-23 DIAGNOSIS — M9903 Segmental and somatic dysfunction of lumbar region: Secondary | ICD-10-CM | POA: Diagnosis not present

## 2023-03-23 DIAGNOSIS — M5136 Other intervertebral disc degeneration, lumbar region: Secondary | ICD-10-CM | POA: Diagnosis not present

## 2023-03-30 DIAGNOSIS — M5136 Other intervertebral disc degeneration, lumbar region: Secondary | ICD-10-CM | POA: Diagnosis not present

## 2023-03-30 DIAGNOSIS — M9903 Segmental and somatic dysfunction of lumbar region: Secondary | ICD-10-CM | POA: Diagnosis not present

## 2023-03-30 DIAGNOSIS — M9905 Segmental and somatic dysfunction of pelvic region: Secondary | ICD-10-CM | POA: Diagnosis not present

## 2023-03-30 DIAGNOSIS — M9904 Segmental and somatic dysfunction of sacral region: Secondary | ICD-10-CM | POA: Diagnosis not present

## 2023-04-06 DIAGNOSIS — M9903 Segmental and somatic dysfunction of lumbar region: Secondary | ICD-10-CM | POA: Diagnosis not present

## 2023-04-06 DIAGNOSIS — M9905 Segmental and somatic dysfunction of pelvic region: Secondary | ICD-10-CM | POA: Diagnosis not present

## 2023-04-06 DIAGNOSIS — M9904 Segmental and somatic dysfunction of sacral region: Secondary | ICD-10-CM | POA: Diagnosis not present

## 2023-04-06 DIAGNOSIS — M5136 Other intervertebral disc degeneration, lumbar region: Secondary | ICD-10-CM | POA: Diagnosis not present

## 2023-04-13 DIAGNOSIS — M9905 Segmental and somatic dysfunction of pelvic region: Secondary | ICD-10-CM | POA: Diagnosis not present

## 2023-04-13 DIAGNOSIS — M5136 Other intervertebral disc degeneration, lumbar region: Secondary | ICD-10-CM | POA: Diagnosis not present

## 2023-04-13 DIAGNOSIS — M9903 Segmental and somatic dysfunction of lumbar region: Secondary | ICD-10-CM | POA: Diagnosis not present

## 2023-04-13 DIAGNOSIS — M9904 Segmental and somatic dysfunction of sacral region: Secondary | ICD-10-CM | POA: Diagnosis not present

## 2023-04-20 DIAGNOSIS — M9904 Segmental and somatic dysfunction of sacral region: Secondary | ICD-10-CM | POA: Diagnosis not present

## 2023-04-20 DIAGNOSIS — M9905 Segmental and somatic dysfunction of pelvic region: Secondary | ICD-10-CM | POA: Diagnosis not present

## 2023-04-20 DIAGNOSIS — M5136 Other intervertebral disc degeneration, lumbar region: Secondary | ICD-10-CM | POA: Diagnosis not present

## 2023-04-20 DIAGNOSIS — M9903 Segmental and somatic dysfunction of lumbar region: Secondary | ICD-10-CM | POA: Diagnosis not present

## 2023-04-27 DIAGNOSIS — M9904 Segmental and somatic dysfunction of sacral region: Secondary | ICD-10-CM | POA: Diagnosis not present

## 2023-04-27 DIAGNOSIS — M9905 Segmental and somatic dysfunction of pelvic region: Secondary | ICD-10-CM | POA: Diagnosis not present

## 2023-04-27 DIAGNOSIS — M5136 Other intervertebral disc degeneration, lumbar region: Secondary | ICD-10-CM | POA: Diagnosis not present

## 2023-04-27 DIAGNOSIS — M9903 Segmental and somatic dysfunction of lumbar region: Secondary | ICD-10-CM | POA: Diagnosis not present

## 2023-04-29 DIAGNOSIS — E7800 Pure hypercholesterolemia, unspecified: Secondary | ICD-10-CM | POA: Diagnosis not present

## 2023-04-29 DIAGNOSIS — E039 Hypothyroidism, unspecified: Secondary | ICD-10-CM | POA: Diagnosis not present

## 2023-04-29 DIAGNOSIS — Z0001 Encounter for general adult medical examination with abnormal findings: Secondary | ICD-10-CM | POA: Diagnosis not present

## 2023-04-29 DIAGNOSIS — Z9181 History of falling: Secondary | ICD-10-CM | POA: Diagnosis not present

## 2023-04-29 DIAGNOSIS — Z79899 Other long term (current) drug therapy: Secondary | ICD-10-CM | POA: Diagnosis not present

## 2023-04-29 DIAGNOSIS — E113293 Type 2 diabetes mellitus with mild nonproliferative diabetic retinopathy without macular edema, bilateral: Secondary | ICD-10-CM | POA: Diagnosis not present

## 2023-04-29 DIAGNOSIS — E113292 Type 2 diabetes mellitus with mild nonproliferative diabetic retinopathy without macular edema, left eye: Secondary | ICD-10-CM | POA: Diagnosis not present

## 2023-04-29 DIAGNOSIS — I700 Atherosclerosis of aorta: Secondary | ICD-10-CM | POA: Diagnosis not present

## 2023-05-11 DIAGNOSIS — M9903 Segmental and somatic dysfunction of lumbar region: Secondary | ICD-10-CM | POA: Diagnosis not present

## 2023-05-11 DIAGNOSIS — M9904 Segmental and somatic dysfunction of sacral region: Secondary | ICD-10-CM | POA: Diagnosis not present

## 2023-05-11 DIAGNOSIS — M9905 Segmental and somatic dysfunction of pelvic region: Secondary | ICD-10-CM | POA: Diagnosis not present

## 2023-05-11 DIAGNOSIS — M5136 Other intervertebral disc degeneration, lumbar region: Secondary | ICD-10-CM | POA: Diagnosis not present

## 2023-05-18 DIAGNOSIS — M9904 Segmental and somatic dysfunction of sacral region: Secondary | ICD-10-CM | POA: Diagnosis not present

## 2023-05-18 DIAGNOSIS — M9905 Segmental and somatic dysfunction of pelvic region: Secondary | ICD-10-CM | POA: Diagnosis not present

## 2023-05-18 DIAGNOSIS — M5136 Other intervertebral disc degeneration, lumbar region: Secondary | ICD-10-CM | POA: Diagnosis not present

## 2023-05-18 DIAGNOSIS — M9903 Segmental and somatic dysfunction of lumbar region: Secondary | ICD-10-CM | POA: Diagnosis not present

## 2023-05-25 DIAGNOSIS — M5136 Other intervertebral disc degeneration, lumbar region: Secondary | ICD-10-CM | POA: Diagnosis not present

## 2023-05-25 DIAGNOSIS — M9904 Segmental and somatic dysfunction of sacral region: Secondary | ICD-10-CM | POA: Diagnosis not present

## 2023-05-25 DIAGNOSIS — M9905 Segmental and somatic dysfunction of pelvic region: Secondary | ICD-10-CM | POA: Diagnosis not present

## 2023-05-25 DIAGNOSIS — M9903 Segmental and somatic dysfunction of lumbar region: Secondary | ICD-10-CM | POA: Diagnosis not present

## 2023-06-01 DIAGNOSIS — M5136 Other intervertebral disc degeneration, lumbar region: Secondary | ICD-10-CM | POA: Diagnosis not present

## 2023-06-01 DIAGNOSIS — M9905 Segmental and somatic dysfunction of pelvic region: Secondary | ICD-10-CM | POA: Diagnosis not present

## 2023-06-01 DIAGNOSIS — M9903 Segmental and somatic dysfunction of lumbar region: Secondary | ICD-10-CM | POA: Diagnosis not present

## 2023-06-01 DIAGNOSIS — M9904 Segmental and somatic dysfunction of sacral region: Secondary | ICD-10-CM | POA: Diagnosis not present

## 2023-06-08 DIAGNOSIS — M5136 Other intervertebral disc degeneration, lumbar region: Secondary | ICD-10-CM | POA: Diagnosis not present

## 2023-06-08 DIAGNOSIS — M9903 Segmental and somatic dysfunction of lumbar region: Secondary | ICD-10-CM | POA: Diagnosis not present

## 2023-06-08 DIAGNOSIS — M9905 Segmental and somatic dysfunction of pelvic region: Secondary | ICD-10-CM | POA: Diagnosis not present

## 2023-06-08 DIAGNOSIS — M9904 Segmental and somatic dysfunction of sacral region: Secondary | ICD-10-CM | POA: Diagnosis not present

## 2023-06-15 DIAGNOSIS — M9905 Segmental and somatic dysfunction of pelvic region: Secondary | ICD-10-CM | POA: Diagnosis not present

## 2023-06-15 DIAGNOSIS — M9904 Segmental and somatic dysfunction of sacral region: Secondary | ICD-10-CM | POA: Diagnosis not present

## 2023-06-15 DIAGNOSIS — M5136 Other intervertebral disc degeneration, lumbar region: Secondary | ICD-10-CM | POA: Diagnosis not present

## 2023-06-15 DIAGNOSIS — M9903 Segmental and somatic dysfunction of lumbar region: Secondary | ICD-10-CM | POA: Diagnosis not present

## 2023-06-22 DIAGNOSIS — M5136 Other intervertebral disc degeneration, lumbar region: Secondary | ICD-10-CM | POA: Diagnosis not present

## 2023-06-22 DIAGNOSIS — M9905 Segmental and somatic dysfunction of pelvic region: Secondary | ICD-10-CM | POA: Diagnosis not present

## 2023-06-22 DIAGNOSIS — M9904 Segmental and somatic dysfunction of sacral region: Secondary | ICD-10-CM | POA: Diagnosis not present

## 2023-06-22 DIAGNOSIS — M9903 Segmental and somatic dysfunction of lumbar region: Secondary | ICD-10-CM | POA: Diagnosis not present

## 2023-06-29 DIAGNOSIS — M9903 Segmental and somatic dysfunction of lumbar region: Secondary | ICD-10-CM | POA: Diagnosis not present

## 2023-06-29 DIAGNOSIS — M5136 Other intervertebral disc degeneration, lumbar region: Secondary | ICD-10-CM | POA: Diagnosis not present

## 2023-06-29 DIAGNOSIS — M9904 Segmental and somatic dysfunction of sacral region: Secondary | ICD-10-CM | POA: Diagnosis not present

## 2023-06-29 DIAGNOSIS — M9905 Segmental and somatic dysfunction of pelvic region: Secondary | ICD-10-CM | POA: Diagnosis not present

## 2023-07-06 DIAGNOSIS — M9904 Segmental and somatic dysfunction of sacral region: Secondary | ICD-10-CM | POA: Diagnosis not present

## 2023-07-06 DIAGNOSIS — M9905 Segmental and somatic dysfunction of pelvic region: Secondary | ICD-10-CM | POA: Diagnosis not present

## 2023-07-06 DIAGNOSIS — M5136 Other intervertebral disc degeneration, lumbar region: Secondary | ICD-10-CM | POA: Diagnosis not present

## 2023-07-06 DIAGNOSIS — M9903 Segmental and somatic dysfunction of lumbar region: Secondary | ICD-10-CM | POA: Diagnosis not present

## 2023-07-13 DIAGNOSIS — M5136 Other intervertebral disc degeneration, lumbar region: Secondary | ICD-10-CM | POA: Diagnosis not present

## 2023-07-13 DIAGNOSIS — M9905 Segmental and somatic dysfunction of pelvic region: Secondary | ICD-10-CM | POA: Diagnosis not present

## 2023-07-13 DIAGNOSIS — M9904 Segmental and somatic dysfunction of sacral region: Secondary | ICD-10-CM | POA: Diagnosis not present

## 2023-07-13 DIAGNOSIS — M9903 Segmental and somatic dysfunction of lumbar region: Secondary | ICD-10-CM | POA: Diagnosis not present

## 2023-07-20 DIAGNOSIS — M5136 Other intervertebral disc degeneration, lumbar region: Secondary | ICD-10-CM | POA: Diagnosis not present

## 2023-07-20 DIAGNOSIS — M9905 Segmental and somatic dysfunction of pelvic region: Secondary | ICD-10-CM | POA: Diagnosis not present

## 2023-07-20 DIAGNOSIS — M9904 Segmental and somatic dysfunction of sacral region: Secondary | ICD-10-CM | POA: Diagnosis not present

## 2023-07-20 DIAGNOSIS — M9903 Segmental and somatic dysfunction of lumbar region: Secondary | ICD-10-CM | POA: Diagnosis not present

## 2023-07-27 DIAGNOSIS — M9902 Segmental and somatic dysfunction of thoracic region: Secondary | ICD-10-CM | POA: Diagnosis not present

## 2023-07-27 DIAGNOSIS — M9903 Segmental and somatic dysfunction of lumbar region: Secondary | ICD-10-CM | POA: Diagnosis not present

## 2023-07-27 DIAGNOSIS — M9901 Segmental and somatic dysfunction of cervical region: Secondary | ICD-10-CM | POA: Diagnosis not present

## 2023-07-27 DIAGNOSIS — M9904 Segmental and somatic dysfunction of sacral region: Secondary | ICD-10-CM | POA: Diagnosis not present

## 2023-08-03 DIAGNOSIS — M9901 Segmental and somatic dysfunction of cervical region: Secondary | ICD-10-CM | POA: Diagnosis not present

## 2023-08-03 DIAGNOSIS — M9904 Segmental and somatic dysfunction of sacral region: Secondary | ICD-10-CM | POA: Diagnosis not present

## 2023-08-03 DIAGNOSIS — M9902 Segmental and somatic dysfunction of thoracic region: Secondary | ICD-10-CM | POA: Diagnosis not present

## 2023-08-03 DIAGNOSIS — M9903 Segmental and somatic dysfunction of lumbar region: Secondary | ICD-10-CM | POA: Diagnosis not present

## 2023-08-10 DIAGNOSIS — M9902 Segmental and somatic dysfunction of thoracic region: Secondary | ICD-10-CM | POA: Diagnosis not present

## 2023-08-10 DIAGNOSIS — M9901 Segmental and somatic dysfunction of cervical region: Secondary | ICD-10-CM | POA: Diagnosis not present

## 2023-08-10 DIAGNOSIS — M9903 Segmental and somatic dysfunction of lumbar region: Secondary | ICD-10-CM | POA: Diagnosis not present

## 2023-08-10 DIAGNOSIS — M9904 Segmental and somatic dysfunction of sacral region: Secondary | ICD-10-CM | POA: Diagnosis not present

## 2023-10-17 DIAGNOSIS — M5134 Other intervertebral disc degeneration, thoracic region: Secondary | ICD-10-CM | POA: Diagnosis not present

## 2023-10-17 DIAGNOSIS — M9902 Segmental and somatic dysfunction of thoracic region: Secondary | ICD-10-CM | POA: Diagnosis not present

## 2023-10-18 DIAGNOSIS — M5134 Other intervertebral disc degeneration, thoracic region: Secondary | ICD-10-CM | POA: Diagnosis not present

## 2023-10-18 DIAGNOSIS — M9902 Segmental and somatic dysfunction of thoracic region: Secondary | ICD-10-CM | POA: Diagnosis not present

## 2023-10-19 DIAGNOSIS — M9902 Segmental and somatic dysfunction of thoracic region: Secondary | ICD-10-CM | POA: Diagnosis not present

## 2023-10-19 DIAGNOSIS — M5134 Other intervertebral disc degeneration, thoracic region: Secondary | ICD-10-CM | POA: Diagnosis not present

## 2023-10-24 DIAGNOSIS — M5134 Other intervertebral disc degeneration, thoracic region: Secondary | ICD-10-CM | POA: Diagnosis not present

## 2023-10-24 DIAGNOSIS — M9902 Segmental and somatic dysfunction of thoracic region: Secondary | ICD-10-CM | POA: Diagnosis not present

## 2023-10-31 DIAGNOSIS — Z79899 Other long term (current) drug therapy: Secondary | ICD-10-CM | POA: Diagnosis not present

## 2023-10-31 DIAGNOSIS — I10 Essential (primary) hypertension: Secondary | ICD-10-CM | POA: Diagnosis not present

## 2023-10-31 DIAGNOSIS — E039 Hypothyroidism, unspecified: Secondary | ICD-10-CM | POA: Diagnosis not present

## 2023-10-31 DIAGNOSIS — E7800 Pure hypercholesterolemia, unspecified: Secondary | ICD-10-CM | POA: Diagnosis not present

## 2023-10-31 DIAGNOSIS — E113293 Type 2 diabetes mellitus with mild nonproliferative diabetic retinopathy without macular edema, bilateral: Secondary | ICD-10-CM | POA: Diagnosis not present

## 2023-10-31 DIAGNOSIS — G8929 Other chronic pain: Secondary | ICD-10-CM | POA: Diagnosis not present

## 2023-10-31 DIAGNOSIS — I700 Atherosclerosis of aorta: Secondary | ICD-10-CM | POA: Diagnosis not present

## 2023-12-20 DIAGNOSIS — M25562 Pain in left knee: Secondary | ICD-10-CM | POA: Diagnosis not present

## 2024-01-04 DIAGNOSIS — M1A9XX0 Chronic gout, unspecified, without tophus (tophi): Secondary | ICD-10-CM | POA: Diagnosis not present

## 2024-01-04 DIAGNOSIS — M25562 Pain in left knee: Secondary | ICD-10-CM | POA: Diagnosis not present

## 2024-01-18 DIAGNOSIS — M1A9XX0 Chronic gout, unspecified, without tophus (tophi): Secondary | ICD-10-CM | POA: Diagnosis not present

## 2024-01-19 DIAGNOSIS — M6281 Muscle weakness (generalized): Secondary | ICD-10-CM | POA: Diagnosis not present

## 2024-01-19 DIAGNOSIS — M25562 Pain in left knee: Secondary | ICD-10-CM | POA: Diagnosis not present

## 2024-01-19 DIAGNOSIS — R2681 Unsteadiness on feet: Secondary | ICD-10-CM | POA: Diagnosis not present

## 2024-01-19 DIAGNOSIS — Z9181 History of falling: Secondary | ICD-10-CM | POA: Diagnosis not present

## 2024-01-23 ENCOUNTER — Encounter (HOSPITAL_BASED_OUTPATIENT_CLINIC_OR_DEPARTMENT_OTHER): Payer: Self-pay

## 2024-01-23 ENCOUNTER — Emergency Department (HOSPITAL_BASED_OUTPATIENT_CLINIC_OR_DEPARTMENT_OTHER)
Admission: EM | Admit: 2024-01-23 | Discharge: 2024-01-23 | Disposition: A | Discharge status: Home / Self Care | Attending: Emergency Medicine | Admitting: Emergency Medicine

## 2024-01-23 ENCOUNTER — Emergency Department (HOSPITAL_BASED_OUTPATIENT_CLINIC_OR_DEPARTMENT_OTHER)

## 2024-01-23 ENCOUNTER — Other Ambulatory Visit: Payer: Self-pay

## 2024-01-23 DIAGNOSIS — N200 Calculus of kidney: Secondary | ICD-10-CM | POA: Diagnosis not present

## 2024-01-23 DIAGNOSIS — D259 Leiomyoma of uterus, unspecified: Secondary | ICD-10-CM | POA: Diagnosis not present

## 2024-01-23 DIAGNOSIS — Z7984 Long term (current) use of oral hypoglycemic drugs: Secondary | ICD-10-CM | POA: Insufficient documentation

## 2024-01-23 DIAGNOSIS — E119 Type 2 diabetes mellitus without complications: Secondary | ICD-10-CM | POA: Diagnosis not present

## 2024-01-23 DIAGNOSIS — N202 Calculus of kidney with calculus of ureter: Secondary | ICD-10-CM | POA: Insufficient documentation

## 2024-01-23 DIAGNOSIS — K8020 Calculus of gallbladder without cholecystitis without obstruction: Secondary | ICD-10-CM | POA: Diagnosis not present

## 2024-01-23 DIAGNOSIS — R109 Unspecified abdominal pain: Secondary | ICD-10-CM | POA: Diagnosis present

## 2024-01-23 DIAGNOSIS — I10 Essential (primary) hypertension: Secondary | ICD-10-CM | POA: Diagnosis not present

## 2024-01-23 DIAGNOSIS — Z79899 Other long term (current) drug therapy: Secondary | ICD-10-CM | POA: Insufficient documentation

## 2024-01-23 DIAGNOSIS — R1030 Lower abdominal pain, unspecified: Secondary | ICD-10-CM | POA: Diagnosis not present

## 2024-01-23 DIAGNOSIS — N132 Hydronephrosis with renal and ureteral calculous obstruction: Secondary | ICD-10-CM | POA: Diagnosis not present

## 2024-01-23 LAB — URINALYSIS, ROUTINE W REFLEX MICROSCOPIC
Bacteria, UA: NONE SEEN
Bilirubin Urine: NEGATIVE
Glucose, UA: NEGATIVE mg/dL
Ketones, ur: NEGATIVE mg/dL
Leukocytes,Ua: NEGATIVE
Nitrite: NEGATIVE
Specific Gravity, Urine: >1.046 — ABNORMAL HIGH (ref 1.005–1.030)
pH: 5.5 (ref 5.0–8.0)

## 2024-01-23 LAB — COMPREHENSIVE METABOLIC PANEL
ALT: 7 U/L (ref 0–44)
AST: 12 U/L — ABNORMAL LOW (ref 15–41)
Albumin: 4.2 g/dL (ref 3.5–5.0)
Alkaline Phosphatase: 57 U/L (ref 38–126)
Anion gap: 8 (ref 5–15)
BUN: 15 mg/dL (ref 8–23)
CO2: 25 mmol/L (ref 22–32)
Calcium: 9.6 mg/dL (ref 8.9–10.3)
Chloride: 106 mmol/L (ref 98–111)
Creatinine, Ser: 0.9 mg/dL (ref 0.44–1.00)
GFR, Estimated: >60 mL/min (ref >60)
Glucose, Bld: 124 mg/dL — ABNORMAL HIGH (ref 70–99)
Potassium: 4.5 mmol/L (ref 3.5–5.1)
Sodium: 139 mmol/L (ref 135–145)
Total Bilirubin: 0.6 mg/dL (ref 0.0–1.2)
Total Protein: 6.7 g/dL (ref 6.5–8.1)

## 2024-01-23 LAB — CBC
HCT: 36.9 % (ref 36.0–46.0)
Hemoglobin: 12.1 g/dL (ref 12.0–15.0)
MCH: 29.9 pg (ref 26.0–34.0)
MCHC: 32.8 g/dL (ref 30.0–36.0)
MCV: 91.1 fL (ref 80.0–100.0)
Platelets: 322 10*3/uL (ref 150–400)
RBC: 4.05 MIL/uL (ref 3.87–5.11)
RDW: 13.4 % (ref 11.5–15.5)
WBC: 11.3 10*3/uL — ABNORMAL HIGH (ref 4.0–10.5)
nRBC: 0 % (ref 0.0–0.2)

## 2024-01-23 LAB — LIPASE, BLOOD: Lipase: 24 U/L (ref 11–51)

## 2024-01-23 MED ORDER — ONDANSETRON 4 MG PO TBDP
8.0000 mg | ORAL_TABLET | Freq: Once | ORAL | Status: AC
  Administered 2024-01-23: 8 mg via ORAL
  Filled 2024-01-23: qty 2

## 2024-01-23 MED ORDER — ONDANSETRON HCL 4 MG/2ML IJ SOLN
4.0000 mg | Freq: Once | INTRAMUSCULAR | Status: AC
  Administered 2024-01-23: 4 mg via INTRAVENOUS
  Filled 2024-01-23: qty 2

## 2024-01-23 MED ORDER — FENTANYL CITRATE PF 50 MCG/ML IJ SOSY
50.0000 ug | PREFILLED_SYRINGE | Freq: Once | INTRAMUSCULAR | Status: AC
  Administered 2024-01-23: 50 ug via INTRAVENOUS
  Filled 2024-01-23: qty 1

## 2024-01-23 MED ORDER — ACETAMINOPHEN 500 MG PO TABS: 500.0000 mg | ORAL_TABLET | Freq: Four times a day (QID) | ORAL | 0 refills | Status: AC

## 2024-01-23 MED ORDER — TAMSULOSIN HCL 0.4 MG PO CAPS: 0.4000 mg | ORAL_CAPSULE | Freq: Every day | ORAL | 0 refills | Status: DC

## 2024-01-23 MED ORDER — OXYCODONE-ACETAMINOPHEN 5-325 MG PO TABS
1.0000 | ORAL_TABLET | Freq: Once | ORAL | Status: AC
  Administered 2024-01-23: 1 via ORAL
  Filled 2024-01-23: qty 1

## 2024-01-23 MED ORDER — KETOROLAC TROMETHAMINE 15 MG/ML IJ SOLN
15.0000 mg | Freq: Once | INTRAMUSCULAR | Status: AC
  Administered 2024-01-23: 15 mg via INTRAVENOUS
  Filled 2024-01-23: qty 1

## 2024-01-23 MED ORDER — IBUPROFEN 600 MG PO TABS: 600.0000 mg | ORAL_TABLET | Freq: Four times a day (QID) | ORAL | 0 refills | Status: AC

## 2024-01-23 MED ORDER — OXYCODONE-ACETAMINOPHEN 5-325 MG PO TABS: 1.0000 | ORAL_TABLET | Freq: Three times a day (TID) | ORAL | 0 refills | Status: DC | PRN

## 2024-01-23 MED ORDER — IOHEXOL 300 MG/ML  SOLN
100.0000 mL | Freq: Once | INTRAMUSCULAR | Status: AC | PRN
  Administered 2024-01-23: 100 mL via INTRAVENOUS

## 2024-01-23 NOTE — Discharge Instructions (Addendum)
 We saw you in the ER for the abdominal pain. Our results indicate that you have a kidney stone. We were able to get your pain is relative control, and we can safely send you home.  Take the meds prescribed. Set up an appointment with the Urologist by calling the number provided. If the pain is unbearable, you start having fevers, chills, and are unable to keep any meds down - then return to the ER.

## 2024-01-23 NOTE — ED Provider Notes (Signed)
 [place] EMERGENCY DEPARTMENT AT [facility] Provider Note   CSN: [id number] Arrival date & time: 01/23/24  1600     History  Chief Complaint  Patient presents with   Abdominal Pain    Angelica Mccarthy is a 73 y.o. female.  HPI     73 year old female comes in with chief complaint of abdominal pain. Patient has history of diabetes, hyperlipidemia, hypertension.  Patient indicates that she started having abdominal pain about 3 days ago.  Abdominal pain is right-sided, mostly in the flank and lower region.  Pain is constant, dull, and there is waxing and waning intensity.  She denies any associated urinary symptoms including blood in the urine.  She denies any bloody stools.  Patient has no past medical history of kidney stones and denies any surgical history.  Home Medications Prior to Admission medications   Medication Sig Start Date End Date Taking? Authorizing Provider  acetaminophen (TYLENOL) 500 MG tablet Take 1 tablet (500 mg total) by mouth every 6 (six) hours. 01/23/24  Yes Derwood Kaplan, MD  ibuprofen (ADVIL) 600 MG tablet Take 1 tablet (600 mg total) by mouth 4 (four) times daily. 01/23/24  Yes Derwood Kaplan, MD  oxyCODONE-acetaminophen (PERCOCET/ROXICET) 5-325 MG tablet Take 1 tablet by mouth every 8 (eight) hours as needed for severe pain (pain score 7-10). 01/23/24  Yes Ruven Corradi, MD  tamsulosin (FLOMAX) 0.4 MG CAPS capsule Take 1 capsule (0.4 mg total) by mouth daily. 01/23/24  Yes Jarielys Girardot, MD  atenolol (TENORMIN) 50 MG tablet Take 50 mg by mouth daily with supper. 09/04/17   [provider]  BESIVANCE 0.6 % SUSP 1 drop See admin instructions. Begin 3 days prior to surgery. Place 1 drop in operative eye 3 times daily. Place 1 drop in operative eye the morning of surgery and continue 1 week after. 11/04/17   [provider]  cetirizine (ZYRTEC) 10 MG tablet Take 10 mg by mouth daily.    [provider]  Cholecalciferol  (VITAMIN D3) 1000 units CAPS Take 1,000 Units by mouth daily with supper.    [provider]  docusate sodium (COLACE) 100 MG capsule Take 200 mg by mouth daily with supper.    [provider]  hydrochlorothiazide (HYDRODIURIL) 25 MG tablet Take 25 mg by mouth daily. 09/12/17   [provider]  irbesartan (AVAPRO) 150 MG tablet Take 150 mg by mouth daily. 09/12/17   [provider]  levothyroxine (SYNTHROID, LEVOTHROID) 100 MCG tablet Take 100 mcg by mouth daily before breakfast. 09/12/17   [provider]  meclizine (ANTIVERT) 12.5 MG tablet Take 12.5 mg by mouth 3 (three) times daily as needed for dizziness.    [provider]  metFORMIN (GLUCOPHAGE) 500 MG tablet Take 2,000 mg by mouth 2 (two) times daily.  03/17/18   [provider]  omeprazole (PRILOSEC OTC) 20 MG tablet Take 20 mg by mouth daily.    [provider]  potassium chloride SA (K-DUR,KLOR-CON) 20 MEQ tablet Take 20 mEq by mouth daily with supper. 09/12/17   [provider]  rosuvastatin (CRESTOR) 10 MG tablet Take 10 mg by mouth daily with supper. 08/07/17   [provider]  traMADol (ULTRAM) 50 MG tablet Take by mouth every 6 (six) hours as needed.    [provider]      Allergies    Colchicine, Lisinopril-hydrochlorothiazide, and Zocor [simvastatin]    Review of Systems   Review of Systems  All other systems reviewed  and are negative.   Physical Exam Updated Vital Signs BP (!) 165/78   Pulse (!) 44   Temp 97.8 F (36.6 C)   Resp 18   Ht 5\' 2"  (1.575 m)   Wt 88.5 kg   SpO2 94%   BMI 35.67 kg/m  Physical Exam Vitals and nursing note reviewed.  Constitutional:      Appearance: She is well-developed.  HENT:     Head: Normocephalic and atraumatic.  Eyes:     Extraocular Movements: Extraocular movements intact.  Cardiovascular:     Rate and Rhythm: Normal rate.  Pulmonary:     Effort: Pulmonary effort is normal.   Abdominal:     Tenderness: There is abdominal tenderness.     Comments: Right lower quadrant and flank tenderness.  Negative Murphy's, negative McBurney's  Musculoskeletal:     Cervical back: Normal range of motion and neck supple.  Skin:    General: Skin is dry.  Neurological:     Mental Status: She is alert and oriented to person, place, and time.     ED Results / Procedures / Treatments   Labs (all labs ordered are listed, but only abnormal results are displayed) Labs Reviewed  COMPREHENSIVE METABOLIC PANEL - Abnormal; Notable for the following components:      Result Value   Glucose, Bld 124 (*)    AST 12 (*)    All other components within normal limits  CBC - Abnormal; Notable for the following components:   WBC 11.3 (*)    All other components within normal limits  URINALYSIS, ROUTINE W REFLEX MICROSCOPIC - Abnormal; Notable for the following components:   Specific Gravity, Urine >1.046 (*)    Hgb urine dipstick SMALL (*)    Protein, ur TRACE (*)    All other components within normal limits  LIPASE, BLOOD    EKG None  Radiology CT ABDOMEN PELVIS W CONTRAST Result Date: 01/23/2024 CLINICAL DATA:  Abdominal and flank pain with stone suspected. Right lower quadrant abdominal pain. Pain started over the weekend and worse today. Nausea and vomiting today. EXAM: CT ABDOMEN AND PELVIS WITH CONTRAST TECHNIQUE: Multidetector CT imaging of the abdomen and pelvis was performed using the standard protocol following bolus administration of intravenous contrast. RADIATION DOSE REDUCTION: This exam was performed according to the departmental dose-optimization program which includes automated exposure control, adjustment of the mA and/or kV according to patient size and/or use of iterative reconstruction technique. CONTRAST:  OMNIPAQUE IOHEXOL 300 MG/ML  SOLN COMPARISON:  09/25/2019 FINDINGS: Lower chest: Multiple small nodules in the lung bases. Largest on the right, series 3,  image 2, measures 4 mm. Nodules appear unchanged since previous comparison study dating back to 05/27/2016. Long-term stability suggests benign etiology and no follow-up imaging is indicated. Hepatobiliary: Cholelithiasis with small stones in the gallbladder. No inflammatory changes. No focal liver lesions. No bile duct dilatation. Pancreas: Unremarkable. No pancreatic ductal dilatation or surrounding inflammatory changes. Spleen: Normal in size without focal abnormality. Adrenals/Urinary Tract: No adrenal gland nodules. 8 mm stone in the right ureteropelvic junction with proximal hydronephrosis and stranding around the right kidney. Delayed right nephrogram. Distal ureter is decompressed. Additional nonobstructing stone in the lower pole of the right kidney measuring 6 mm diameter. The left kidney, left ureter, and the bladder are unremarkable. Stomach/Bowel: Stomach is within normal limits. Appendix appears normal. No evidence of bowel Hottinger thickening, distention, or inflammatory changes. Vascular/Lymphatic: Aortic atherosclerosis. No enlarged abdominal or pelvic lymph nodes. Reproductive: Calcified fibroid  in the uterus. No abnormal adnexal masses. Other: No free air or free fluid in the abdomen. Lipoma in the inferior left flank musculature. No discrete hernias. Musculoskeletal: Slight anterior subluxation at L4-5 is unchanged since prior study, likely degenerative. IMPRESSION: 1. 8 mm stone in the right ureteropelvic junction with moderate proximal obstruction. 2. 6 mm nonobstructing stone in the lower pole right kidney. 3. Cholelithiasis without evidence of acute cholecystitis. 4. Aortic atherosclerosis. 5. Multiple pulmonary nodules demonstrated in the lung bases, largest measuring 4 mm. No change since prior study. Long-term stability is consistent with benign etiology and no imaging follow-up is indicated. 6. Uterine fibroids. Electronically Signed   By: Burman Nieves M.D.   On: 01/23/2024 19:41     Procedures Procedures    Medications Ordered in ED Medications  oxyCODONE-acetaminophen (PERCOCET/ROXICET) 5-325 MG per tablet 1 tablet (has no administration in time range)  ondansetron (ZOFRAN-ODT) disintegrating tablet 8 mg (has no administration in time range)  iohexol (OMNIPAQUE) 300 MG/ML solution 100 mL (100 mLs Intravenous Contrast Given 01/23/24 1708)  fentaNYL (SUBLIMAZE) injection 50 mcg (50 mcg Intravenous Given 01/23/24 1750)  ondansetron (ZOFRAN) injection 4 mg (4 mg Intravenous Given 01/23/24 1750)  ketorolac (TORADOL) 15 MG/ML injection 15 mg (15 mg Intravenous Given 01/23/24 2055)    ED Course/ Medical Decision Making/ A&P                                 Medical Decision Making Amount and/or Complexity of Data Reviewed Labs: ordered. Radiology: ordered.  Risk Prescription drug management.   This patient presents to the ED with chief complaint(s) of abdominal pain on the right side with pertinent past medical history of diabetes, hypertension, hyperlipidemia.The complaint involves an extensive differential diagnosis and also carries with it a high risk of complications and morbidity.    The differential diagnosis includes : Acute UTI, pyelonephritis, kidney stone, gallstones, diverticulitis, ileus.  The initial plan is to get basic labs, and CT scan with contrast.   Additional history obtained: Additional history obtained from spouse  Independent labs interpretation:  The following labs were independently interpreted: Patient has slight leukocytosis.  Creatinine is reassuring.  UA pending at this time.  Independent visualization and interpretation of imaging: - I independently visualized the following imaging with scope of interpretation limited to determining acute life threatening conditions related to emergency care: CT abdomen pelvis without contrast, which revealed no concerning hydronephrosis.  No perforation.  Per radiologist, patient has 8 mm UPJ  stone with mild hydronephrosis.  Treatment and Reassessment: Patient reassessed.  Results of the ED workup discussed with her.  She indicates that she did have a wave of pain again, the IV medicine here did not help much.  She continues to have 6 out of 10 pain at this time. She is comfortable going home, and giving a try to see if the stone passes on its own.  Consultation: - Consulted or discussed management/test interpretation with external professional: Dr. Jennette Bill. He is comfortable with patient getting Toradol now to get her pain better controlled.  He will try to work patient in later this week.  Patient is comfortable with the plan as well.  Final Clinical Impression(s) / ED Diagnoses Final diagnoses:  Nephrolithiasis    Rx / DC Orders ED Discharge Orders          Ordered    acetaminophen (TYLENOL) 500 MG tablet  Every 6 hours  01/23/24 2056    ibuprofen (ADVIL) 600 MG tablet  4 times daily        01/23/24 2056    oxyCODONE-acetaminophen (PERCOCET/ROXICET) 5-325 MG tablet  Every 8 hours PRN        01/23/24 2056    tamsulosin (FLOMAX) 0.4 MG CAPS capsule  Daily        01/23/24 2056              Derwood Kaplan, MD 01/23/24 2057

## 2024-01-23 NOTE — ED Triage Notes (Signed)
 In for eval of right side abd pain. Reports area being sore over the weekend and worsened this am. Nausea and vomited x2 today. Denies chills, fever, or dysuria.

## 2024-01-24 DIAGNOSIS — M1712 Unilateral primary osteoarthritis, left knee: Secondary | ICD-10-CM | POA: Diagnosis not present

## 2024-01-25 DIAGNOSIS — N200 Calculus of kidney: Secondary | ICD-10-CM | POA: Diagnosis not present

## 2024-01-26 ENCOUNTER — Other Ambulatory Visit: Payer: Self-pay | Admitting: Urology

## 2024-01-27 ENCOUNTER — Encounter (HOSPITAL_COMMUNITY): Payer: Self-pay | Admitting: Urology

## 2024-01-27 DIAGNOSIS — M109 Gout, unspecified: Secondary | ICD-10-CM

## 2024-01-27 HISTORY — DX: Gout, unspecified: M10.9

## 2024-01-27 NOTE — Progress Notes (Signed)
 Spoke w/ via phone for pre-op interview--- Lab needs dos---- KUB        Lab results------ COVID test -not needed patient states asymptomatic no test needed Arrive at -------0645 NPO after MN NO Solid Food.  Clear liquids from MN until--- Pre-Surgery Ensure or G2:  Med rec completed Medications to take morning of surgery -----Will take around lunch time Diabetic medication -----metformin  GLP1 agonist last dose: GLP1 instructions:  Patient instructed no nail polish to be worn day of surgery Patient instructed to bring photo id and insurance card day of surgery Patient aware to have Driver (ride ) / caregiver    for 24 hours after surgery - spouse- [name] Patient Special Instructions ----- Pre-Op special Instructions -----Angelica Mccarthy  Patient verbalized understanding of instructions that were given at this phone interview. Patient denies chest pain, sob, fever, cough at the interview.

## 2024-01-27 NOTE — H&P (Signed)
 Office Visit Report     01/25/2024   --------------------------------------------------------------------------------   Angelica Mccarthy  MRN: [id number]  DOB: 12/13/1950, 73 year old Female  SSN:    PRIMARY CARE:     REFERRING:  Derwood Kaplan, MD  PROVIDER:  Vilma Prader, M.D.  LOCATION:  [facility] [phone]     --------------------------------------------------------------------------------   CC/HPI: 73 year old female who was initially presented to the ER on 01/23/2024. Initial presentation with abdominal pain that that has been present for 3 days prior to the procedure. Pain is present on the right side.CT demonstrated a R UPJ stone as well as a non obstructing stone. Labs were WNL Cr 0.9, UA negative for infection, WBC 11.3.   PMH: DM2, GERD, HTN, Hypothyroidism, HLD.  PSH: No GU surgery. No ABdominal surgeries.   Nephrolithiasis:  01/25/24: 7mm R UPJ stone, not taking opiates curreently pain is well managed. No F/C, no N/V. No GH. no previous stone hx.     ALLERGIES: Colchicine HCTZ/LISINOPRIL Zocor    MEDICATIONS: Allopurinol 100 MG Tablet  Aspirin  Levothyroxine Sodium 100 MCG Tablet  metFORMIN HCl 500 MG Tablet  Percocet  Tamsulosin HCl 0.4 MG Capsule  Atenolol 50 MG Tablet  Irbesartan 150 MG Tablet  Meclizine HCl 12.5 MG Tablet  Potassium  Rosuvastatin Calcium 10 MG Tablet  traMADol HCl     GU PSH: No GU PSH    NON-GU PSH: No Non-GU PSH    GU PMH: None   NON-GU PMH: Arthritis Diabetes Type 2 Hypercholesterolemia Hypertension    FAMILY HISTORY: No Family History    SOCIAL HISTORY: Marital Status: Married Preferred Language: English; Ethnicity: Not Hispanic Or Latino; Race: White Current Smoking Status: Patient has never smoked.   Tobacco Use Assessment Completed: Used Tobacco in last 30 days? Does not use smokeless tobacco. Has never drank.  Does not use drugs. Drinks 2 caffeinated drinks per day. Has not had a  blood transfusion.    REVIEW OF SYSTEMS:    GU Review Female:   Patient denies frequent urination, hard to postpone urination, burning /pain with urination, get up at night to urinate, leakage of urine, stream starts and stops, trouble starting your stream, have to strain to urinate, and being pregnant.  Gastrointestinal (Upper):   Patient reports nausea and vomiting. Patient denies indigestion/ heartburn.  Gastrointestinal (Lower):   Patient denies diarrhea and constipation.  Constitutional:   Patient denies fatigue, night sweats, weight loss, and fever.  Skin:   Patient denies skin rash/ lesion and itching.  Eyes:   Patient denies blurred vision and double vision.  Ears/ Nose/ Throat:   Patient denies sore throat and sinus problems.  Hematologic/Lymphatic:   Patient denies swollen glands and easy bruising.  Cardiovascular:   Patient denies leg swelling and chest pains.  Respiratory:   Patient reports cough. Patient denies shortness of breath.  Endocrine:   Patient denies excessive thirst.  Musculoskeletal:   Patient reports back pain and joint pain.   Neurological:   Patient reports dizziness. Patient denies headaches.  Psychologic:   Patient denies depression and anxiety.   VITAL SIGNS:      01/25/2024 03:59 PM  BP 129/79 mmHg  Pulse 79 /min  Temperature 96.4 F / 35.7 C   MULTI-SYSTEM PHYSICAL EXAMINATION:    Constitutional: Well-nourished. No physical deformities. Normally developed. Good grooming.  Respiratory: No labored breathing, no use of accessory muscles.   Cardiovascular: Normal temperature, normal extremity pulses, no swelling, no varicosities.  Skin: No paleness, no jaundice, no cyanosis. No lesion, no ulcer, no rash.  Musculoskeletal: uses wheel chair for ambulation     Complexity of Data:  Source Of History:  Patient  Records Review:   Previous Patient Records  Urine Test Review:   Urinalysis  X-Ray Review: C.T. Abdomen/Pelvis: Reviewed Films. Reviewed Report. R  ureteral stone 7mm and R renal pelvis stone mild R hydro.NO stones or abnoamlities L uretrer     PROCEDURES:          Visit Complexity - G2211          Urinalysis w/Scope Dipstick Dipstick Cont'd Micro  Color: Yellow Bilirubin: Neg mg/dL WBC/hpf: 0 - 5/hpf  Appearance: Clear Ketones: Neg mg/dL RBC/hpf: 0 - 2/hpf  Specific Gravity: 1.025 Blood: 1+ ery/uL Bacteria: Rare (0-9/hpf)  pH: 5.5 Protein: Neg mg/dL Cystals: NS (Not Seen)  Glucose: Neg mg/dL Urobilinogen: 0.2 mg/dL Casts: NS (Not Seen)    Nitrites: Neg Trichomonas: Not Present    Leukocyte Esterase: Neg leu/uL Mucous: Not Present      Epithelial Cells: 0 - 5/hpf      Yeast: NS (Not Seen)      Sperm: Not Present    ASSESSMENT:      ICD-10 Details  1 GU:   Renal calculus - N20.0 Undiagnosed New Problem   PLAN:           Orders Labs Urine Culture          Document Letter(s):  Created for Patient: Clinical Summary         Notes:   R ureteral stone: We discussed options of surgery including observation, ESWL, and URS. After review options and risks patient would prefer to utilize ESWL for her stone explained that probability of stone free rate is 80% based on skin to stone distance.  Stone is visible on KUB.  We discussed risks including bleeding , infection, and possible risk of not passing all stone fragments patient would like to proceed.   UCx ordered today   CC: Joanna Puff, MD     * Signed by Vilma Prader, M.D. on 01/25/24 at 4:31 PM (EDT)*

## 2024-01-30 ENCOUNTER — Encounter (HOSPITAL_COMMUNITY)
Admission: RE | Disposition: A | Discharge status: Home / Self Care | Payer: Self-pay | Source: Home / Self Care | Attending: Urology

## 2024-01-30 ENCOUNTER — Other Ambulatory Visit: Payer: Self-pay

## 2024-01-30 ENCOUNTER — Encounter (HOSPITAL_COMMUNITY): Payer: Self-pay

## 2024-01-30 ENCOUNTER — Ambulatory Visit (HOSPITAL_COMMUNITY)

## 2024-01-30 ENCOUNTER — Encounter (HOSPITAL_COMMUNITY): Payer: Self-pay | Admitting: Urology

## 2024-01-30 ENCOUNTER — Ambulatory Visit (HOSPITAL_COMMUNITY)
Admission: RE | Admit: 2024-01-30 | Discharge: 2024-01-30 | Disposition: A | Discharge status: Home / Self Care | Attending: Urology | Admitting: Urology

## 2024-01-30 DIAGNOSIS — N202 Calculus of kidney with calculus of ureter: Secondary | ICD-10-CM | POA: Insufficient documentation

## 2024-01-30 DIAGNOSIS — N201 Calculus of ureter: Secondary | ICD-10-CM | POA: Diagnosis not present

## 2024-01-30 DIAGNOSIS — D259 Leiomyoma of uterus, unspecified: Secondary | ICD-10-CM | POA: Diagnosis not present

## 2024-01-30 HISTORY — PX: EXTRACORPOREAL SHOCK WAVE LITHOTRIPSY: SHX1557

## 2024-01-30 LAB — GLUCOSE, CAPILLARY
Glucose-Capillary: 116 mg/dL — ABNORMAL HIGH (ref 70–99)
Glucose-Capillary: 111 mg/dL — ABNORMAL HIGH (ref 70–99)

## 2024-01-30 SURGERY — LITHOTRIPSY, ESWL
Anesthesia: LOCAL | Laterality: Right

## 2024-01-30 MED ORDER — DIAZEPAM 5 MG PO TABS
10.0000 mg | ORAL_TABLET | ORAL | Status: AC
Start: 2024-01-30 — End: 2024-01-30
  Administered 2024-01-30: 10 mg via ORAL
  Filled 2024-01-30: qty 2

## 2024-01-30 MED ORDER — CIPROFLOXACIN HCL 500 MG PO TABS
500.0000 mg | ORAL_TABLET | ORAL | Status: AC
  Administered 2024-01-30: 500 mg via ORAL
  Filled 2024-01-30: qty 1

## 2024-01-30 MED ORDER — SODIUM CHLORIDE 0.9 % IV SOLN: INTRAVENOUS | Status: DC

## 2024-01-30 MED ORDER — OXYCODONE-ACETAMINOPHEN 5-325 MG PO TABS: 1.0000 | ORAL_TABLET | ORAL | 0 refills | Status: AC | PRN

## 2024-01-30 MED ORDER — DIPHENHYDRAMINE HCL 25 MG PO CAPS
25.0000 mg | ORAL_CAPSULE | ORAL | Status: AC
  Administered 2024-01-30: 25 mg via ORAL
  Filled 2024-01-30: qty 1

## 2024-01-30 NOTE — Op Note (Signed)
 See [employer] operative note scanned into chart. Also because of the size, density, location and other factors that cannot be anticipated I feel this will likely be a staged procedure. This fact supersedes any indication in the scanned [place] stone operative note to the contrary.

## 2024-01-30 NOTE — Discharge Instructions (Addendum)
1. You should strain your urine and collect all fragments and bring them to your follow up appointment.  2. You should take your pain medication as needed.  Please call if your pain is severe to the point that it is not controlled with your pain medication. 3. You should call if you develop fever > 101 or persistent nausea or vomiting. 4. Your doctor may prescribe tamsulosin to take to help facilitate stone passage.   Post Anesthesia Home Care Instructions  Activity: Get plenty of rest for the remainder of the day. A responsible individual must stay with you for 24 hours following the procedure.  For the next 24 hours, DO NOT: -Drive a car -Operate machinery -Drink alcoholic beverages -Take any medication unless instructed by your physician -Make any legal decisions or sign important papers.  Meals: Start with liquid foods such as gelatin or soup. Progress to regular foods as tolerated. Avoid greasy, spicy, heavy foods. If nausea and/or vomiting occur, drink only clear liquids until the nausea and/or vomiting subsides. Call your physician if vomiting continues.  

## 2024-01-30 NOTE — Interval H&P Note (Signed)
 History and Physical Interval Note:  01/30/2024 7:45 AM  Angelica Mccarthy  has presented today for surgery, with the diagnosis of RIGHT URETERAL STONE.  The various methods of treatment have been discussed with the patient and family. After consideration of risks, benefits and other options for treatment, the patient has consented to  Procedure(s): LITHOTRIPSY, ESWL (Right) as a surgical intervention.  The patient's history has been reviewed, patient examined, no change in status, stable for surgery.  I have reviewed the patient's chart and labs.  Questions were answered to the patient's satisfaction.     [employer]

## 2024-01-31 ENCOUNTER — Encounter (HOSPITAL_COMMUNITY): Payer: Self-pay | Admitting: Urology

## 2024-02-02 DIAGNOSIS — M6281 Muscle weakness (generalized): Secondary | ICD-10-CM | POA: Diagnosis not present

## 2024-02-02 DIAGNOSIS — M25562 Pain in left knee: Secondary | ICD-10-CM | POA: Diagnosis not present

## 2024-02-02 DIAGNOSIS — Z9181 History of falling: Secondary | ICD-10-CM | POA: Diagnosis not present

## 2024-02-02 DIAGNOSIS — R2681 Unsteadiness on feet: Secondary | ICD-10-CM | POA: Diagnosis not present

## 2024-02-12 ENCOUNTER — Emergency Department (HOSPITAL_BASED_OUTPATIENT_CLINIC_OR_DEPARTMENT_OTHER)
Admission: EM | Admit: 2024-02-12 | Discharge: 2024-02-12 | Disposition: A | Discharge status: Home / Self Care | Attending: Emergency Medicine | Admitting: Emergency Medicine

## 2024-02-12 ENCOUNTER — Emergency Department (HOSPITAL_BASED_OUTPATIENT_CLINIC_OR_DEPARTMENT_OTHER)

## 2024-02-12 ENCOUNTER — Other Ambulatory Visit: Payer: Self-pay

## 2024-02-12 ENCOUNTER — Encounter (HOSPITAL_BASED_OUTPATIENT_CLINIC_OR_DEPARTMENT_OTHER): Payer: Self-pay | Admitting: Emergency Medicine

## 2024-02-12 DIAGNOSIS — R7989 Other specified abnormal findings of blood chemistry: Secondary | ICD-10-CM

## 2024-02-12 DIAGNOSIS — X58XXXA Exposure to other specified factors, initial encounter: Secondary | ICD-10-CM | POA: Diagnosis not present

## 2024-02-12 DIAGNOSIS — N201 Calculus of ureter: Secondary | ICD-10-CM | POA: Diagnosis not present

## 2024-02-12 DIAGNOSIS — S37001A Unspecified injury of right kidney, initial encounter: Secondary | ICD-10-CM | POA: Diagnosis present

## 2024-02-12 DIAGNOSIS — S37011A Minor contusion of right kidney, initial encounter: Secondary | ICD-10-CM | POA: Insufficient documentation

## 2024-02-12 DIAGNOSIS — E119 Type 2 diabetes mellitus without complications: Secondary | ICD-10-CM | POA: Diagnosis not present

## 2024-02-12 DIAGNOSIS — K8020 Calculus of gallbladder without cholecystitis without obstruction: Secondary | ICD-10-CM | POA: Diagnosis not present

## 2024-02-12 DIAGNOSIS — R112 Nausea with vomiting, unspecified: Secondary | ICD-10-CM | POA: Diagnosis not present

## 2024-02-12 DIAGNOSIS — R109 Unspecified abdominal pain: Secondary | ICD-10-CM | POA: Diagnosis not present

## 2024-02-12 DIAGNOSIS — R7401 Elevation of levels of liver transaminase levels: Secondary | ICD-10-CM | POA: Insufficient documentation

## 2024-02-12 DIAGNOSIS — Z7984 Long term (current) use of oral hypoglycemic drugs: Secondary | ICD-10-CM | POA: Insufficient documentation

## 2024-02-12 DIAGNOSIS — R110 Nausea: Secondary | ICD-10-CM

## 2024-02-12 DIAGNOSIS — N1330 Unspecified hydronephrosis: Secondary | ICD-10-CM | POA: Diagnosis not present

## 2024-02-12 LAB — CBC
HCT: 37.5 % (ref 36.0–46.0)
Hemoglobin: 12 g/dL (ref 12.0–15.0)
MCH: 28.8 pg (ref 26.0–34.0)
MCHC: 32 g/dL (ref 30.0–36.0)
MCV: 89.9 fL (ref 80.0–100.0)
Platelets: 558 10*3/uL — ABNORMAL HIGH (ref 150–400)
RBC: 4.17 MIL/uL (ref 3.87–5.11)
RDW: 14.3 % (ref 11.5–15.5)
WBC: 14.6 10*3/uL — ABNORMAL HIGH (ref 4.0–10.5)
nRBC: 0 % (ref 0.0–0.2)

## 2024-02-12 LAB — COMPREHENSIVE METABOLIC PANEL WITH GFR
ALT: 7 U/L (ref 0–44)
AST: 13 U/L — ABNORMAL LOW (ref 15–41)
Albumin: 3.9 g/dL (ref 3.5–5.0)
Alkaline Phosphatase: 92 U/L (ref 38–126)
Anion gap: 11 (ref 5–15)
BUN: 22 mg/dL (ref 8–23)
CO2: 19 mmol/L — ABNORMAL LOW (ref 22–32)
Calcium: 10.4 mg/dL — ABNORMAL HIGH (ref 8.9–10.3)
Chloride: 106 mmol/L (ref 98–111)
Creatinine, Ser: 1.78 mg/dL — ABNORMAL HIGH (ref 0.44–1.00)
GFR, Estimated: 30 mL/min — ABNORMAL LOW (ref >60)
Glucose, Bld: 142 mg/dL — ABNORMAL HIGH (ref 70–99)
Potassium: 4.6 mmol/L (ref 3.5–5.1)
Sodium: 136 mmol/L (ref 135–145)
Total Bilirubin: 0.6 mg/dL (ref 0.0–1.2)
Total Protein: 7.9 g/dL (ref 6.5–8.1)

## 2024-02-12 LAB — URINALYSIS, ROUTINE W REFLEX MICROSCOPIC
Bilirubin Urine: NEGATIVE
Glucose, UA: NEGATIVE mg/dL
Ketones, ur: NEGATIVE mg/dL
Nitrite: NEGATIVE
Specific Gravity, Urine: 1.018 (ref 1.005–1.030)
pH: 5.5 (ref 5.0–8.0)

## 2024-02-12 LAB — LIPASE, BLOOD: Lipase: 25 U/L (ref 11–51)

## 2024-02-12 MED ORDER — ONDANSETRON 4 MG PO TBDP: 4.0000 mg | ORAL_TABLET | Freq: Three times a day (TID) | ORAL | 0 refills | Status: AC

## 2024-02-12 MED ORDER — ONDANSETRON 4 MG PO TBDP
4.0000 mg | ORAL_TABLET | Freq: Once | ORAL | Status: AC
  Administered 2024-02-12: 4 mg via ORAL
  Filled 2024-02-12: qty 1

## 2024-02-12 MED ORDER — SODIUM CHLORIDE 0.9 % IV BOLUS
500.0000 mL | Freq: Once | INTRAVENOUS | Status: AC
  Administered 2024-02-12: 500 mL via INTRAVENOUS

## 2024-02-12 MED ORDER — ONDANSETRON HCL 4 MG/2ML IJ SOLN
4.0000 mg | Freq: Once | INTRAMUSCULAR | Status: AC
  Administered 2024-02-12: 4 mg via INTRAVENOUS
  Filled 2024-02-12: qty 2

## 2024-02-12 MED ORDER — TAMSULOSIN HCL 0.4 MG PO CAPS
0.4000 mg | ORAL_CAPSULE | Freq: Once | ORAL | Status: AC
  Administered 2024-02-12: 0.4 mg via ORAL
  Filled 2024-02-12: qty 1

## 2024-02-12 MED ORDER — SODIUM CHLORIDE 0.9 % IV BOLUS
1000.0000 mL | Freq: Once | INTRAVENOUS | Status: AC
  Administered 2024-02-12: 1000 mL via INTRAVENOUS

## 2024-02-12 MED ORDER — METHOCARBAMOL 500 MG PO TABS: 500.0000 mg | ORAL_TABLET | Freq: Three times a day (TID) | ORAL | 0 refills | Status: AC | PRN

## 2024-02-12 MED ORDER — TAMSULOSIN HCL 0.4 MG PO CAPS: 0.4000 mg | ORAL_CAPSULE | Freq: Every day | ORAL | 0 refills | Status: AC

## 2024-02-12 NOTE — Discharge Instructions (Addendum)
 Please read and follow all provided instructions.  Your diagnoses today include:  1. Right ureteral stone   2. Hematoma of right kidney, initial encounter   3. Nausea   4. Elevated serum creatinine     Tests performed today include: Urine test that showed blood in your urine and some infection fighting cells CT scan which showed a hematoma of the right kidney and some stones that are still in the ureter Blood test that showed  weaker than usual kidney function Vital signs. See below for your results today.   Medications prescribed:  Zofran (ondansetron) - for nausea and vomiting  Flomax (tamsulosin) - relaxes smooth muscle to help kidney stones pass  Robaxin (methocarbamol) - muscle relaxer medication  DO NOT drive or perform any activities that require you to be awake and alert because this medicine can make you drowsy.   Take any prescribed medications only as directed.  Home care instructions:  Follow any educational materials contained in this packet.  Use Tylenol needed for pain if as pain is mild, use the Percocet prescribed to you if pain is more severe.  Please double your fluid intake for the next several days. Strain your urine and save any stones that may pass.   BE VERY CAREFUL not to take multiple medicines containing Tylenol (also called acetaminophen). Doing so can lead to an overdose which can damage your liver and cause liver failure and possibly death.   Follow-up instructions: Please follow-up with your urologist or the urologist referral (provided on front page) in the next 1 week for further evaluation of your symptoms.  Return instructions:  If you need to return to the Emergency Department, go to [facility] and not [facility]. The urologists are located at [facility] and can better care for you at this location.  Please return to the Emergency Department if you experience worsening symptoms.  Please return if you develop fever or  uncontrolled pain or vomiting. Please return if you have any other emergent concerns.  Additional Information:  Your vital signs today were: BP 135/76   Pulse 69   Temp 97.7 F (36.5 C) (Oral)   Resp 16   Wt 80.3 kg   SpO2 97%   BMI 32.37 kg/m  If your blood pressure (BP) was elevated above 135/85 this visit, please have this repeated by your doctor within one month. --------------

## 2024-02-12 NOTE — ED Provider Notes (Signed)
 [facility] AT [facility] Provider Note   CSN: [id number] Arrival date & time: 02/12/24  1029     History  No chief complaint on file.   Angelica Mccarthy is a 73 y.o. female.  Patient with history of gout, kidney stones, diabetes --presents to the emergency department for evaluation of bilateral flank pain and nausea.  Patient was diagnosed with a kidney stone in late March.  She followed up with urology and had lithotripsy done on 3/31.  Postprocedure, she was doing well.  She passed a few small stone fragments.  Several days ago she developed recurrent pain on the left side and flank.  This was associated with nausea and chills.  No documented fevers.  Minimal vomiting.  She does report wanting to eat less.  Then about a day ago she developed pain that involve the right flank as well and now seems about even on both sides.  No dysuria, hematuria, increased frequency or urgency.  After her procedure she was placed on a course of amoxicillin for "bacteria in the kidneys".  This gave her a yeast infection which was treated with Monistat.  She completed course of antibiotics.  Patient denies history of diverticulitis or other surgeries.  Husband states that she has been fairly healthy, gout was a new problem for her about 2 months ago, kidney stones recently a new problem in the past few weeks.       Home Medications Prior to Admission medications   Medication Sig Start Date End Date Taking? Authorizing Provider  acetaminophen (TYLENOL) 500 MG tablet Take 1 tablet (500 mg total) by mouth every 6 (six) hours. 01/23/24   Nanavati, Ankit, MD  allopurinol (ZYLOPRIM) 100 MG tablet Take 100 mg by mouth daily.    [provider]  atenolol (TENORMIN) 50 MG tablet Take 50 mg by mouth daily with supper. 09/04/17   [provider]  BESIVANCE 0.6 % SUSP 1 drop See admin instructions. Begin 3 days prior to surgery. Place 1 drop in operative eye 3 times daily.  Place 1 drop in operative eye the morning of surgery and continue 1 week after. 11/04/17   [provider]  cetirizine (ZYRTEC) 10 MG tablet Take 10 mg by mouth daily.    [provider]  Cholecalciferol (VITAMIN D3) 1000 units CAPS Take 1,000 Units by mouth daily with supper.    [provider]  docusate sodium (COLACE) 100 MG capsule Take 200 mg by mouth daily with supper.    [provider]  hydrochlorothiazide (HYDRODIURIL) 25 MG tablet Take 25 mg by mouth daily. 09/12/17   [provider]  ibuprofen (ADVIL) 600 MG tablet Take 1 tablet (600 mg total) by mouth 4 (four) times daily. 01/23/24   Deatra Face, MD  irbesartan (AVAPRO) 150 MG tablet Take 150 mg by mouth daily. 09/12/17   [provider]  levothyroxine (SYNTHROID, LEVOTHROID) 100 MCG tablet Take 100 mcg by mouth daily before breakfast. 09/12/17   [provider]  meclizine (ANTIVERT) 12.5 MG tablet Take 12.5 mg by mouth 3 (three) times daily as needed for dizziness.    [provider]  metFORMIN (GLUCOPHAGE) 500 MG tablet Take 2,000 mg by mouth 2 (two) times daily.  03/17/18   [provider]  omeprazole (PRILOSEC OTC) 20 MG tablet Take 20 mg by mouth daily.    [provider]  oxyCODONE-acetaminophen (PERCOCET) 5-325 MG tablet Take 1 tablet by mouth every 4 (four) hours as needed  for severe pain (pain score 7-10). 01/30/24 01/29/25  Florencio Hunting, MD  oxyCODONE-acetaminophen (PERCOCET/ROXICET) 5-325 MG tablet Take 1 tablet by mouth every 8 (eight) hours as needed for severe pain (pain score 7-10). 01/23/24   Deatra Face, MD  potassium chloride SA (K-DUR,KLOR-CON) 20 MEQ tablet Take 20 mEq by mouth daily with supper. 09/12/17   [provider]  rosuvastatin (CRESTOR) 10 MG tablet Take 10 mg by mouth daily with supper. 08/07/17   [provider]  tamsulosin (FLOMAX) 0.4 MG CAPS capsule Take 1 capsule (0.4 mg total) by mouth daily.  01/23/24   Deatra Face, MD      Allergies    Colchicine, Lisinopril-hydrochlorothiazide, and Zocor [simvastatin]    Review of Systems   Review of Systems  Physical Exam Updated Vital Signs BP 137/88   Pulse 81   Temp 97.7 F (36.5 C) (Oral)   Resp 16   Wt 80.3 kg   SpO2 98%   BMI 32.37 kg/m  Physical Exam Vitals and nursing note reviewed.  Constitutional:      General: She is not in acute distress.    Appearance: She is well-developed.  HENT:     Head: Normocephalic and atraumatic.     Right Ear: External ear normal.     Left Ear: External ear normal.     Nose: Nose normal.  Eyes:     Conjunctiva/sclera: Conjunctivae normal.  Cardiovascular:     Rate and Rhythm: Normal rate and regular rhythm.     Heart sounds: No murmur heard. Pulmonary:     Effort: No respiratory distress.     Breath sounds: No wheezing, rhonchi or rales.  Abdominal:     Palpations: Abdomen is soft.     Tenderness: There is no abdominal tenderness. There is no guarding or rebound.     Comments: No abdominal tenderness elicited on exam.  Musculoskeletal:     Cervical back: Normal range of motion and neck supple.     Right lower leg: No edema.     Left lower leg: No edema.  Skin:    General: Skin is warm and dry.     Findings: No rash.  Neurological:     General: No focal deficit present.     Mental Status: She is alert. Mental status is at baseline.     Motor: No weakness.  Psychiatric:        Mood and Affect: Mood normal.     ED Results / Procedures / Treatments   Labs (all labs ordered are listed, but only abnormal results are displayed) Labs Reviewed  COMPREHENSIVE METABOLIC PANEL WITH GFR - Abnormal; Notable for the following components:      Result Value   CO2 19 (*)    Glucose, Bld 142 (*)    Creatinine, Ser 1.78 (*)    Calcium 10.4 (*)    AST 13 (*)    GFR, Estimated 30 (*)    All other components within normal limits  CBC - Abnormal; Notable for the following  components:   WBC 14.6 (*)    Platelets 558 (*)    All other components within normal limits  LIPASE, BLOOD  URINALYSIS, ROUTINE W REFLEX MICROSCOPIC    EKG None  Radiology CT Renal Stone Study Result Date: 02/12/2024 CLINICAL DATA:  Abdominal/flank pain, stone suspected bilateral flank pain/nausea, recurrent, recent lithotripsy 3/31 EXAM: CT ABDOMEN AND PELVIS WITHOUT CONTRAST TECHNIQUE: Multidetector CT imaging of the abdomen and pelvis was performed following the standard  protocol without IV contrast. RADIATION DOSE REDUCTION: This exam was performed according to the departmental dose-optimization program which includes automated exposure control, adjustment of the mA and/or kV according to patient size and/or use of iterative reconstruction technique. COMPARISON:  January 23, 2024, January 23, 2024, December 20, 2016 FINDINGS: Lower chest: No focal airspace consolidation or pleural effusion.Numerous small lung nodules again noted in the lung bases, the largest in the right lower lobe measures 5 mm (axial 5). Perihilar nodule in the right middle lobe measures 9 mm. Overall, given the long-term stability, these are benign. Hepatobiliary: No mass.Multiple small radiopaque gallstones. No Bowsher thickening.No intrahepatic or extrahepatic biliary ductal dilation. Pancreas: No mass or main ductal dilation.No peripancreatic inflammation or fluid collection. Spleen: Normal size. No mass. Adrenals/Urinary Tract: No adrenal masses. Large subcapsular hematoma surrounding the posterior right renal cortex, measuring 4.5 by 7.1 x 12.3 cm (APxTRxCC). Mild right-sided hydroureteronephrosis due to a few stacked calculi in the mid to distal right ureter, the largest measures 5 x 4 x 4 mm. There is a punctate 2 mm calculus in the right ureteral orifice of the level of the bladder trigone possibly with a couple of punctate calculi in the distal right ureter just above the UVJ. No bladder Howse thickening. Stomach/Bowel: The  stomach is decompressed without focal abnormality. No small bowel Reim thickening or inflammation. No small bowel obstruction. Normal appendix. Vascular/Lymphatic: No aortic aneurysm. Diffuse aortoiliac atherosclerosis. No intraabdominal or pelvic lymphadenopathy. Reproductive: Age-related atrophy of the uterus and ovaries. No concerning adnexal mass.No free pelvic fluid. Other: No pneumoperitoneum, ascites, or mesenteric inflammation. Musculoskeletal: No acute fracture or destructive lesion.Multilevel degenerative disc disease of the spine. Mild grade 1 anterolisthesis of L4 on L5. 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 IMPRESSION: 1. Large subcapsular right perinephric hematoma contained within the right renal fascia, measuring 4.5 x 7.1 x 12.3 cm. No retroperitoneal hematoma. 2. Mild right-sided hydroureteronephrosis due to a few stacked calculi in the mid to distal right ureter, the largest measuring 5 x 4 x 4 mm. Punctate 2 mm calculus also likely present in the right ureteral orifice of the level of the bladder trigone. 3. Cholecystolithiasis. Critical Value/emergent results were called by telephone at the time of interpretation on 02/12/2024 at 11:33 am to provider Dr. Reuel Castle, who verbally acknowledged these results. Electronically Signed   By: Rance Burrows M.D.   On: 02/12/2024 11:40    Procedures Procedures    Medications Ordered in ED Medications - No data to display  ED Course/ Medical Decision Making/ A&P    Patient seen and examined. History obtained directly from patient.   Labs/EKG: Ordered CBC, CMP, lipase, UA.  Imaging: Ordered renal protocol CT.  Medications/Fluids: Ordered: Fluid bolus, Zofran.   Most recent vital signs reviewed and are as follows: BP 137/88   Pulse 81   Temp 97.7 F (36.5 C) (Oral)   Resp 16   Wt 80.3 kg   SpO2 98%   BMI 32.37 kg/m   Initial impression: Bilateral flank pain  12:40 PM Reassessment  performed. Patient appears stable. Pt discussed with and seen by Dr. Leida Puna.   Labs personally reviewed and interpreted including: CBC with elevated white blood cell count 14.6; CMP elevated creatinine at 1.78 with BUN of 22, glucose 142, bicarb 19 slightly low; lipase normal.  Imaging personally visualized and interpreted including: CT scan shows right-sided ureteral stones, right-sided kidney hematoma.  Reviewed pertinent lab work and imaging with patient at bedside. Questions answered.  Most current vital signs reviewed and are as follows: BP 137/88   Pulse 81   Temp 97.7 F (36.5 C) (Oral)   Resp 16   Wt 80.3 kg   SpO2 98%   BMI 32.37 kg/m   Plan: Awaiting UA.  Patient given fluid bolus, additional 500 cc ordered.  Will discuss with urology.  2:52 PM Reassessment performed. Patient appears stable, comfortable.  I discussed case with Dr. Cathi Cluster with urology.  We reviewed imaging, lab workup, recent history.  We discussed 1 of 2 options including admission to hospital with possible stenting tomorrow versus discharged home with antiemetics and close outpatient follow-up.  I discussed these options with the patient and husband at bedside.  We had a discussion regarding the risks and benefits.  Currently the patient symptoms are well-controlled.  She would prefer to trial home medications to try to pass the stone.  She does have pain medication at home to use as well.  We did discuss her creatinine and likely dehydration.  She knows that she will need to hydrate well and follow-up to get her kidney function rechecked.  Plan will be for discharge.  Patient has Percocet at home.  Will prescribe Zofran and Flomax.  Urology also encouraged prescription for Robaxin which I prescribed.  Urology does not recommend antibiotics at this time.  Labs personally reviewed and interpreted including: UA, not clean-catch, does show white and red cells.  Imaging personally visualized and interpreted  including:  Reviewed pertinent lab work and imaging with patient at bedside. Questions answered.   Most current vital signs reviewed and are as follows: BP (!) 145/57   Pulse 73   Temp 97.7 F (36.5 C) (Oral)   Resp 18   Wt 80.3 kg   SpO2 95%   BMI 32.37 kg/m   Plan: Discharge to home.   Prescriptions written for: Flomax, Robaxin, Zofran  Other home care instructions discussed: Use of antiemetics on a scheduled basis for the next couple of days to help control symptoms.  Discussed drinking plenty of fluids until urine is clear to very light yellow.  ED return instructions discussed: Discussed return to [facility] if she has uncontrolled pain, cannot drink well, has significant nausea or vomiting.  Follow-up instructions discussed: Patient encouraged to follow-up with their urologist tomorrow.  Dr. Cathi Cluster noted that he will try to call the patient tomorrow to see how she is doing.                                Medical Decision Making Amount and/or Complexity of Data Reviewed Labs: ordered. Radiology: ordered.  Risk Prescription drug management.   Patient with bilateral flank pain after lithotripsy 2 weeks ago.  Workup today demonstrates right-sided ureteral stones.  Patient has had poor oral intake over the past few days.  Suspect element of dehydration and modest bump in creatinine.  This was treated with 1.5 L of IV fluid.  Patient is making urine.  CT scan also does show a perinephric hematoma.  Blood cell counts are stable.  No concern for pyelonephritis or infection at this point.  Plan discussed with urology.  Patient prefers outpatient trial of treatment and close follow-up.  Patient and urology and myself are comfortable with this plan.  Patient does seem reliable to return if her symptoms to get worse.  The patient's vital signs, pertinent lab work and imaging were reviewed and interpreted as discussed  in the ED course. Hospitalization was considered for further  testing, treatments, or serial exams/observation. However as patient is well-appearing, has a stable exam, and reassuring studies today, I do not feel that they warrant admission at this time. This plan was discussed with the patient who verbalizes agreement and comfort with this plan and seems reliable and able to return to the Emergency Department with worsening or changing symptoms.          Final Clinical Impression(s) / ED Diagnoses Final diagnoses:  Right ureteral stone  Hematoma of right kidney, initial encounter  Nausea  Elevated serum creatinine    Rx / DC Orders ED Discharge Orders          Ordered    tamsulosin (FLOMAX) 0.4 MG CAPS capsule  Daily        02/12/24 1417    ondansetron (ZOFRAN-ODT) 4 MG disintegrating tablet  Every 8 hours        02/12/24 1417    methocarbamol (ROBAXIN) 500 MG tablet  Every 8 hours PRN        02/12/24 1417              Lyna Sandhoff, PA-C 02/12/24 1456    Almond Army, MD 02/16/24 1505

## 2024-02-12 NOTE — ED Triage Notes (Signed)
 Pt had lithotripsy 3/31, she has had low level nausea since,not really eating /drinking much. Her bilateral flank and back pain returned 2 -3 days ago and constant.

## 2024-02-23 DIAGNOSIS — R110 Nausea: Secondary | ICD-10-CM | POA: Diagnosis not present

## 2024-02-23 DIAGNOSIS — N289 Disorder of kidney and ureter, unspecified: Secondary | ICD-10-CM | POA: Diagnosis not present

## 2024-02-23 DIAGNOSIS — R42 Dizziness and giddiness: Secondary | ICD-10-CM | POA: Diagnosis not present

## 2024-02-23 DIAGNOSIS — T148XXA Other injury of unspecified body region, initial encounter: Secondary | ICD-10-CM | POA: Diagnosis not present

## 2024-02-23 DIAGNOSIS — N200 Calculus of kidney: Secondary | ICD-10-CM | POA: Diagnosis not present

## 2024-02-24 DIAGNOSIS — R300 Dysuria: Secondary | ICD-10-CM | POA: Diagnosis not present

## 2024-02-29 DIAGNOSIS — N202 Calculus of kidney with calculus of ureter: Secondary | ICD-10-CM | POA: Diagnosis not present

## 2024-02-29 DIAGNOSIS — M1A00X0 Idiopathic chronic gout, unspecified site, without tophus (tophi): Secondary | ICD-10-CM | POA: Diagnosis not present

## 2024-02-29 DIAGNOSIS — N201 Calculus of ureter: Secondary | ICD-10-CM | POA: Diagnosis not present

## 2024-02-29 DIAGNOSIS — N200 Calculus of kidney: Secondary | ICD-10-CM | POA: Diagnosis not present

## 2024-03-08 DIAGNOSIS — N200 Calculus of kidney: Secondary | ICD-10-CM | POA: Diagnosis not present

## 2024-03-23 DIAGNOSIS — N201 Calculus of ureter: Secondary | ICD-10-CM | POA: Diagnosis not present

## 2024-04-02 ENCOUNTER — Other Ambulatory Visit: Payer: Self-pay | Admitting: Family Medicine

## 2024-04-02 DIAGNOSIS — Z1231 Encounter for screening mammogram for malignant neoplasm of breast: Secondary | ICD-10-CM

## 2024-04-11 DIAGNOSIS — M9905 Segmental and somatic dysfunction of pelvic region: Secondary | ICD-10-CM | POA: Diagnosis not present

## 2024-04-11 DIAGNOSIS — M5136 Other intervertebral disc degeneration, lumbar region with discogenic back pain only: Secondary | ICD-10-CM | POA: Diagnosis not present

## 2024-04-11 DIAGNOSIS — M9903 Segmental and somatic dysfunction of lumbar region: Secondary | ICD-10-CM | POA: Diagnosis not present

## 2024-04-11 DIAGNOSIS — M9904 Segmental and somatic dysfunction of sacral region: Secondary | ICD-10-CM | POA: Diagnosis not present

## 2024-04-19 ENCOUNTER — Ambulatory Visit
Admission: RE | Admit: 2024-04-19 | Discharge: 2024-04-19 | Disposition: A | Discharge status: Home / Self Care | Source: Ambulatory Visit | Attending: Family Medicine | Admitting: Family Medicine

## 2024-04-19 DIAGNOSIS — Z1231 Encounter for screening mammogram for malignant neoplasm of breast: Secondary | ICD-10-CM

## 2024-05-16 DIAGNOSIS — E7800 Pure hypercholesterolemia, unspecified: Secondary | ICD-10-CM | POA: Diagnosis not present

## 2024-05-16 DIAGNOSIS — Z0001 Encounter for general adult medical examination with abnormal findings: Secondary | ICD-10-CM | POA: Diagnosis not present

## 2024-05-16 DIAGNOSIS — Z79899 Other long term (current) drug therapy: Secondary | ICD-10-CM | POA: Diagnosis not present

## 2024-05-16 DIAGNOSIS — I700 Atherosclerosis of aorta: Secondary | ICD-10-CM | POA: Diagnosis not present

## 2024-05-16 DIAGNOSIS — R252 Cramp and spasm: Secondary | ICD-10-CM | POA: Diagnosis not present

## 2024-05-16 DIAGNOSIS — E039 Hypothyroidism, unspecified: Secondary | ICD-10-CM | POA: Diagnosis not present

## 2024-05-16 DIAGNOSIS — D649 Anemia, unspecified: Secondary | ICD-10-CM | POA: Diagnosis not present

## 2024-05-16 DIAGNOSIS — M1A9XX0 Chronic gout, unspecified, without tophus (tophi): Secondary | ICD-10-CM | POA: Diagnosis not present

## 2024-05-16 DIAGNOSIS — M1A00X0 Idiopathic chronic gout, unspecified site, without tophus (tophi): Secondary | ICD-10-CM | POA: Diagnosis not present

## 2024-05-16 DIAGNOSIS — Z1331 Encounter for screening for depression: Secondary | ICD-10-CM | POA: Diagnosis not present

## 2024-05-16 DIAGNOSIS — E113293 Type 2 diabetes mellitus with mild nonproliferative diabetic retinopathy without macular edema, bilateral: Secondary | ICD-10-CM | POA: Diagnosis not present

## 2024-06-06 DIAGNOSIS — Z79899 Other long term (current) drug therapy: Secondary | ICD-10-CM | POA: Diagnosis not present

## 2024-07-03 ENCOUNTER — Other Ambulatory Visit (HOSPITAL_COMMUNITY): Payer: Self-pay | Admitting: Family Medicine

## 2024-07-03 ENCOUNTER — Ambulatory Visit (HOSPITAL_COMMUNITY)
Admission: RE | Admit: 2024-07-03 | Discharge: 2024-07-03 | Disposition: A | Discharge status: Home / Self Care | Source: Ambulatory Visit | Attending: Vascular Surgery | Admitting: Vascular Surgery

## 2024-07-03 DIAGNOSIS — R252 Cramp and spasm: Secondary | ICD-10-CM

## 2024-07-04 LAB — VAS US ABI WITH/WO TBI
Left ABI: 1.07
Right ABI: 1.19

## 2024-07-31 DIAGNOSIS — E113293 Type 2 diabetes mellitus with mild nonproliferative diabetic retinopathy without macular edema, bilateral: Secondary | ICD-10-CM | POA: Diagnosis not present

## 2024-08-02 DIAGNOSIS — Z23 Encounter for immunization: Secondary | ICD-10-CM | POA: Diagnosis not present

## 2024-08-08 ENCOUNTER — Encounter: Admitting: Internal Medicine

## 2024-08-16 DIAGNOSIS — E113293 Type 2 diabetes mellitus with mild nonproliferative diabetic retinopathy without macular edema, bilateral: Secondary | ICD-10-CM | POA: Diagnosis not present

## 2024-08-16 DIAGNOSIS — M1A9XX0 Chronic gout, unspecified, without tophus (tophi): Secondary | ICD-10-CM | POA: Diagnosis not present

## 2024-08-16 DIAGNOSIS — D649 Anemia, unspecified: Secondary | ICD-10-CM | POA: Diagnosis not present

## 2024-08-16 DIAGNOSIS — E039 Hypothyroidism, unspecified: Secondary | ICD-10-CM | POA: Diagnosis not present

## 2024-08-16 DIAGNOSIS — M5136 Other intervertebral disc degeneration, lumbar region with discogenic back pain only: Secondary | ICD-10-CM | POA: Diagnosis not present

## 2024-08-16 DIAGNOSIS — Z79899 Other long term (current) drug therapy: Secondary | ICD-10-CM | POA: Diagnosis not present

## 2024-08-23 DIAGNOSIS — Z23 Encounter for immunization: Secondary | ICD-10-CM | POA: Diagnosis not present

## 2024-08-28 DIAGNOSIS — D649 Anemia, unspecified: Secondary | ICD-10-CM | POA: Diagnosis not present
